# Patient Record
Sex: Male | Born: 1940 | Race: White | Hispanic: No | Marital: Married | Smoking: Never smoker
Health system: Southern US, Community
[De-identification: ages and names within clinical notes are randomized; demographics above are authoritative.]

## PROBLEM LIST (undated history)

## (undated) DIAGNOSIS — E538 Deficiency of other specified B group vitamins: Secondary | ICD-10-CM

## (undated) DIAGNOSIS — L309 Dermatitis, unspecified: Secondary | ICD-10-CM

## (undated) DIAGNOSIS — Z8509 Personal history of malignant neoplasm of other digestive organs: Secondary | ICD-10-CM

## (undated) DIAGNOSIS — E559 Vitamin D deficiency, unspecified: Secondary | ICD-10-CM

## (undated) DIAGNOSIS — E785 Hyperlipidemia, unspecified: Secondary | ICD-10-CM

## (undated) DIAGNOSIS — D696 Thrombocytopenia, unspecified: Secondary | ICD-10-CM

## (undated) HISTORY — PX: CHOLECYSTECTOMY: SHX55

## (undated) HISTORY — PX: BACK SURGERY: SHX140

## (undated) HISTORY — DX: Deficiency of other specified B group vitamins: E53.8

## (undated) HISTORY — DX: Vitamin D deficiency, unspecified: E55.9

## (undated) HISTORY — DX: Thrombocytopenia, unspecified: D69.6

## (undated) HISTORY — DX: Dermatitis, unspecified: L30.9

## (undated) HISTORY — DX: Hyperlipidemia, unspecified: E78.5

## (undated) HISTORY — PX: OTHER SURGICAL HISTORY: SHX169

## (undated) HISTORY — DX: Personal history of malignant neoplasm of other digestive organs: Z85.09

## (undated) HISTORY — PX: APPENDECTOMY: SHX54

---

## 2000-02-04 ENCOUNTER — Ambulatory Visit: Admission: RE | Admit: 2000-02-04 | Discharge: 2000-02-04 | Payer: Self-pay | Admitting: Internal Medicine

## 2016-07-05 ENCOUNTER — Inpatient Hospital Stay (HOSPITAL_COMMUNITY)
Admission: EM | Admit: 2016-07-05 | Discharge: 2016-07-11 | DRG: 435 | Disposition: A | Discharge status: Other Acute Inpatient Hospital | Payer: Medicare Other | Attending: Family Medicine | Admitting: Family Medicine

## 2016-07-05 ENCOUNTER — Encounter (HOSPITAL_COMMUNITY): Payer: Self-pay

## 2016-07-05 ENCOUNTER — Emergency Department (HOSPITAL_COMMUNITY): Payer: Medicare Other

## 2016-07-05 DIAGNOSIS — K831 Obstruction of bile duct: Secondary | ICD-10-CM

## 2016-07-05 DIAGNOSIS — N400 Enlarged prostate without lower urinary tract symptoms: Secondary | ICD-10-CM | POA: Diagnosis present

## 2016-07-05 DIAGNOSIS — Z885 Allergy status to narcotic agent status: Secondary | ICD-10-CM

## 2016-07-05 DIAGNOSIS — N401 Enlarged prostate with lower urinary tract symptoms: Secondary | ICD-10-CM | POA: Diagnosis present

## 2016-07-05 DIAGNOSIS — L299 Pruritus, unspecified: Secondary | ICD-10-CM | POA: Diagnosis not present

## 2016-07-05 DIAGNOSIS — Z23 Encounter for immunization: Secondary | ICD-10-CM

## 2016-07-05 DIAGNOSIS — K567 Ileus, unspecified: Secondary | ICD-10-CM

## 2016-07-05 DIAGNOSIS — R9389 Abnormal findings on diagnostic imaging of other specified body structures: Secondary | ICD-10-CM

## 2016-07-05 DIAGNOSIS — C240 Malignant neoplasm of extrahepatic bile duct: Secondary | ICD-10-CM | POA: Diagnosis present

## 2016-07-05 DIAGNOSIS — Z9049 Acquired absence of other specified parts of digestive tract: Secondary | ICD-10-CM

## 2016-07-05 DIAGNOSIS — C801 Malignant (primary) neoplasm, unspecified: Secondary | ICD-10-CM

## 2016-07-05 DIAGNOSIS — R1031 Right lower quadrant pain: Secondary | ICD-10-CM

## 2016-07-05 DIAGNOSIS — K859 Acute pancreatitis without necrosis or infection, unspecified: Secondary | ICD-10-CM

## 2016-07-05 DIAGNOSIS — R140 Abdominal distension (gaseous): Secondary | ICD-10-CM

## 2016-07-05 DIAGNOSIS — K838 Other specified diseases of biliary tract: Secondary | ICD-10-CM | POA: Diagnosis present

## 2016-07-05 DIAGNOSIS — R17 Unspecified jaundice: Secondary | ICD-10-CM | POA: Diagnosis present

## 2016-07-05 DIAGNOSIS — R338 Other retention of urine: Secondary | ICD-10-CM | POA: Diagnosis present

## 2016-07-05 DIAGNOSIS — R1011 Right upper quadrant pain: Secondary | ICD-10-CM | POA: Diagnosis present

## 2016-07-05 LAB — COMPREHENSIVE METABOLIC PANEL
ALT: 195 U/L — ABNORMAL HIGH (ref 17–63)
AST: 153 U/L — ABNORMAL HIGH (ref 15–41)
Albumin: 3.7 g/dL (ref 3.5–5.0)
Alkaline Phosphatase: 313 U/L — ABNORMAL HIGH (ref 38–126)
Anion gap: 6 (ref 5–15)
BUN: 13 mg/dL (ref 6–20)
CO2: 29 mmol/L (ref 22–32)
Calcium: 9.4 mg/dL (ref 8.9–10.3)
Chloride: 103 mmol/L (ref 101–111)
Creatinine, Ser: 0.74 mg/dL (ref 0.61–1.24)
GFR calc Af Amer: >60 mL/min (ref >60)
GFR calc non Af Amer: >60 mL/min (ref >60)
Glucose, Bld: 92 mg/dL (ref 65–99)
Potassium: 3.9 mmol/L (ref 3.5–5.1)
Sodium: 138 mmol/L (ref 135–145)
Total Bilirubin: 9.2 mg/dL — ABNORMAL HIGH (ref 0.3–1.2)
Total Protein: 7.1 g/dL (ref 6.5–8.1)

## 2016-07-05 LAB — URINALYSIS, ROUTINE W REFLEX MICROSCOPIC
GLUCOSE, UA: NEGATIVE mg/dL
Hgb urine dipstick: NEGATIVE
KETONES UR: NEGATIVE mg/dL
LEUKOCYTES UA: NEGATIVE
Nitrite: NEGATIVE
PH: 6 (ref 5.0–8.0)
Protein, ur: NEGATIVE mg/dL
Specific Gravity, Urine: 1.01 (ref 1.005–1.030)

## 2016-07-05 LAB — CBC
HEMATOCRIT: 37.4 % — AB (ref 39.0–52.0)
Hemoglobin: 12.9 g/dL — ABNORMAL LOW (ref 13.0–17.0)
MCH: 29.7 pg (ref 26.0–34.0)
MCHC: 34.5 g/dL (ref 30.0–36.0)
MCV: 86.2 fL (ref 78.0–100.0)
Platelets: 185 10*3/uL (ref 150–400)
RBC: 4.34 MIL/uL (ref 4.22–5.81)
RDW: 15.3 % (ref 11.5–15.5)
WBC: 6.5 10*3/uL (ref 4.0–10.5)

## 2016-07-05 LAB — LIPASE, BLOOD: Lipase: 52 U/L — ABNORMAL HIGH (ref 11–51)

## 2016-07-05 MED ORDER — IOPAMIDOL (ISOVUE-300) INJECTION 61%
100.0000 mL | Freq: Once | INTRAVENOUS | Status: AC | PRN
  Administered 2016-07-05: 100 mL via INTRAVENOUS

## 2016-07-05 NOTE — Progress Notes (Signed)
Patient listed as having [employer] without a pcp.  Patient confirms his pcp is Dr. Wende Neighbors in [place].  Sytem updated.

## 2016-07-05 NOTE — ED Triage Notes (Addendum)
Pt c/o RLQ abdominal pain x 3 weeks. Pt also states that his stool has been gray in color and his urine has been orange. Denies blood ing stool or urine. Also endorsing nausea and indigestion. Hx of cholecystectomy. Denies vomiting, diarrhea, chest pain, or SOB.

## 2016-07-05 NOTE — ED Provider Notes (Signed)
[facility] DEPT Provider Note   CSN: [id number] Arrival date & time: 07/05/16  1859     History   Chief Complaint Chief Complaint  Patient presents with  . Abdominal Pain    HPI DIAMON RENSCH is a 75 y.o. male.  HPI   Lower right abdominal pain for 3-4 days Aching pain, comes and goes, no pain at this time No fevers No n/v/diarrhea Stool looking gray for 3 weeks, urine looking orange for 3 weeks Occasional tylenol use. No etoh use.   History reviewed. No pertinent past medical history.  Patient Active Problem List   Diagnosis Date Noted  . Jaundice of recent onset 07/06/2016  . Abdominal pain, acute, right upper quadrant 07/06/2016  . Common bile duct (CBD) obstruction 07/06/2016  . 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 Biliary obstruction 07/06/2016    Past Surgical History:  Procedure Laterality Date  . APPENDECTOMY    . BACK SURGERY    . CHOLECYSTECTOMY         Home Medications    Prior to Admission medications   Not on File    Family History History reviewed. No pertinent family history.  Social History Social History  Substance Use Topics  . Smoking status: Never Smoker  . Smokeless tobacco: Never Used  . Alcohol use No     Allergies   Percocet [oxycodone-acetaminophen]   Review of Systems Review of Systems  Constitutional: Negative for fever.  Respiratory: Negative for cough and shortness of breath.   Cardiovascular: Negative for chest pain.  Gastrointestinal: Positive for abdominal pain. Negative for constipation, diarrhea, nausea and vomiting.  Genitourinary: Negative for dysuria.  Skin: Negative for rash.  Neurological: Negative for headaches (haven't eaten since noon, attributes ha to this, mild).     Physical Exam Updated Vital Signs BP (!) 153/81 (BP Location: Right Arm)   Pulse 67   Temp 98.2 F (36.8 C) (Oral)   Resp 18   Ht 5\' 7"  (1.702 m)   Wt 145 lb 11.6 oz (66.1 kg)   SpO2 100%   BMI 22.82 kg/m    Physical Exam   ED Treatments / Results  Labs (all labs ordered are listed, but only abnormal results are displayed) Labs Reviewed  LIPASE, BLOOD - Abnormal; Notable for the following:       Result Value   Lipase 52 (*)    All other components within normal limits  COMPREHENSIVE METABOLIC PANEL - Abnormal; Notable for the following:    AST 153 (*)    ALT 195 (*)    Alkaline Phosphatase 313 (*)    Total Bilirubin 9.2 (*)    All other components within normal limits  CBC - Abnormal; Notable for the following:    Hemoglobin 12.9 (*)    HCT 37.4 (*)    All other components within normal limits  URINALYSIS, ROUTINE W REFLEX MICROSCOPIC (NOT AT [facility]) - Abnormal; Notable for the following:    Color, Urine AMBER (*)    Bilirubin Urine MODERATE (*)    All other components within normal limits  SURGICAL PCR SCREEN  COMPREHENSIVE METABOLIC PANEL  CBC    EKG  EKG Interpretation None       Radiology Ct Abdomen Pelvis W Contrast  Result Date: 07/05/2016 CLINICAL DATA:  Subacute onset of right lower quadrant abdominal pain and nausea. Initial encounter. EXAM: CT ABDOMEN AND PELVIS WITH CONTRAST TECHNIQUE: Multidetector CT imaging of the abdomen and pelvis was performed using the standard protocol following  bolus administration of intravenous contrast. CONTRAST:  134mL ISOVUE-300 IOPAMIDOL (ISOVUE-300) INJECTION 61% COMPARISON:  CT of the abdomen and pelvis from 05/07/2011 FINDINGS: Lower chest: The visualized lung bases are grossly clear. The visualized portions of the mediastinum are unremarkable. Hepatobiliary: There is marked dilatation of the intrahepatic biliary ducts, particularly at the left hepatic lobe, though also to some extent within the inferior right hepatic lobe. On evaluation of coronal images, this is thought to reflect a mass at the proximal common bile duct along the edge of the liver, measuring approximately 2.4 x 1.7 x 1.7 cm, concerning for a cholangiocarcinoma.  The common bile duct remains normal in caliber status post cholecystectomy. Clips are noted at the gallbladder fossa. Pancreas: The pancreas is within normal limits. Spleen: The spleen is unremarkable in appearance. Adrenals/Urinary Tract: The adrenal glands are unremarkable in appearance. Scattered bilateral renal cysts are noted, larger on the left. Nonspecific perinephric stranding is noted bilaterally. There is no evidence of hydronephrosis. No renal or ureteral stones are identified. Stomach/Bowel: The stomach is unremarkable in appearance. The small bowel is within normal limits. The patient is status post appendectomy. The colon is unremarkable in appearance. Vascular/Lymphatic: Scattered calcification is seen along the abdominal aorta and its branches. There is slight ectasia of the infrarenal abdominal aorta, without evidence of aneurysmal dilatation. The inferior vena cava is grossly unremarkable. No retroperitoneal lymphadenopathy is seen. No pelvic sidewall lymphadenopathy is identified. Reproductive: The prostate is mildly enlarged, measuring 5.1 cm in transverse dimension. The bladder is mildly distended and grossly unremarkable. Other: No additional soft tissue abnormalities are seen. Musculoskeletal: No acute osseous abnormalities are identified. Disc space narrowing is noted along the lower lumbar spine. The visualized musculature is unremarkable in appearance. IMPRESSION: 1. Marked dilatation of the intrahepatic biliary ducts, particularly at the left hepatic lobe, though also to some extent within the inferior right hepatic lobe. On evaluation of coronal images, this is thought to reflect a mass at the proximal common bile duct along the edge of the liver, measuring approximately 2.4 x 1.7 x 1.7 cm, concerning for cholangiocarcinoma. ERCP or MRCP is recommended for further evaluation. 2. Scattered bilateral renal cysts noted. 3. Scattered aortic atherosclerosis. 4. Mildly enlarged prostate.  Electronically Signed   By: Garald Balding M.D.   On: 07/05/2016 23:56    Procedures Procedures (including critical care time)  Medications Ordered in ED Medications  0.9 %  sodium chloride infusion (not administered)  ondansetron (ZOFRAN) tablet 4 mg (not administered)    Or  ondansetron (ZOFRAN) injection 4 mg (not administered)  ketorolac (TORADOL) 15 MG/ML injection 15 mg (not administered)  iopamidol (ISOVUE-300) 61 % injection 100 mL (100 mLs Intravenous Contrast Given 07/05/16 2309)     Initial Impression / Assessment and Plan / ED Course  I have reviewed the triage vital signs and the nursing notes.  Pertinent labs & imaging results that were available during my care of the patient were reviewed by me and considered in my medical decision making (see chart for details).  Clinical Course   75 year old male presents to concern for acholic stool, dark urine for 3 weeks, in 3-4 days of right-sided abdominal pain. Labs show signs of biliary obstruction, with elevated AST, ALT, alkaline phosphatase and bilirubin. CT abdomen and pelvis was obtained which shows significant hepatobiliary dilation, with concern for mass at the proximal bile duct, concerning for cholangiocarcinoma.  Dr. Shanon Brow was consulted for admission.  Discussed with Dr. Penelope Coop of GI who recommends hospitalist admission, and  gastroenterology consult in the morning for likely ERCP.  Final Clinical Impressions(s) / ED Diagnoses   Final diagnoses:  Biliary obstruction, concern for mass as etiology, possible cholangiocarcinoma    New Prescriptions There are no discharge medications for this patient.    Gareth Morgan, MD 07/06/16 [phone]

## 2016-07-05 NOTE — ED Notes (Signed)
Pt has urine sample in triage.

## 2016-07-05 NOTE — ED Notes (Signed)
Trendon Naimi - daughter - [phone] requests to know where her father is moved to if assigned a room as they are about to leave.

## 2016-07-05 NOTE — ED Notes (Signed)
Patient transported to CT 

## 2016-07-06 ENCOUNTER — Other Ambulatory Visit (HOSPITAL_COMMUNITY): Payer: Self-pay | Admitting: Radiology

## 2016-07-06 ENCOUNTER — Encounter (HOSPITAL_COMMUNITY): Payer: Self-pay | Admitting: *Deleted

## 2016-07-06 ENCOUNTER — Inpatient Hospital Stay (HOSPITAL_COMMUNITY): Payer: Medicare Other

## 2016-07-06 DIAGNOSIS — Z23 Encounter for immunization: Secondary | ICD-10-CM | POA: Diagnosis not present

## 2016-07-06 DIAGNOSIS — K831 Obstruction of bile duct: Secondary | ICD-10-CM | POA: Diagnosis present

## 2016-07-06 DIAGNOSIS — L299 Pruritus, unspecified: Secondary | ICD-10-CM | POA: Diagnosis not present

## 2016-07-06 DIAGNOSIS — R17 Unspecified jaundice: Secondary | ICD-10-CM | POA: Diagnosis not present

## 2016-07-06 DIAGNOSIS — R9389 Abnormal findings on diagnostic imaging of other specified body structures: Secondary | ICD-10-CM | POA: Diagnosis present

## 2016-07-06 DIAGNOSIS — K838 Other specified diseases of biliary tract: Secondary | ICD-10-CM | POA: Diagnosis present

## 2016-07-06 DIAGNOSIS — R338 Other retention of urine: Secondary | ICD-10-CM | POA: Diagnosis present

## 2016-07-06 DIAGNOSIS — Z885 Allergy status to narcotic agent status: Secondary | ICD-10-CM | POA: Diagnosis not present

## 2016-07-06 DIAGNOSIS — N401 Enlarged prostate with lower urinary tract symptoms: Secondary | ICD-10-CM | POA: Diagnosis present

## 2016-07-06 DIAGNOSIS — R1011 Right upper quadrant pain: Secondary | ICD-10-CM | POA: Diagnosis not present

## 2016-07-06 DIAGNOSIS — C240 Malignant neoplasm of extrahepatic bile duct: Secondary | ICD-10-CM | POA: Diagnosis present

## 2016-07-06 DIAGNOSIS — K839 Disease of biliary tract, unspecified: Secondary | ICD-10-CM

## 2016-07-06 DIAGNOSIS — K859 Acute pancreatitis without necrosis or infection, unspecified: Secondary | ICD-10-CM | POA: Diagnosis not present

## 2016-07-06 DIAGNOSIS — R938 Abnormal findings on diagnostic imaging of other specified body structures: Secondary | ICD-10-CM

## 2016-07-06 DIAGNOSIS — Z9049 Acquired absence of other specified parts of digestive tract: Secondary | ICD-10-CM | POA: Diagnosis not present

## 2016-07-06 LAB — COMPREHENSIVE METABOLIC PANEL
ALBUMIN: 3.3 g/dL — AB (ref 3.5–5.0)
ALK PHOS: 296 U/L — AB (ref 38–126)
ALT: 182 U/L — ABNORMAL HIGH (ref 17–63)
ANION GAP: 7 (ref 5–15)
AST: 147 U/L — AB (ref 15–41)
BILIRUBIN TOTAL: 10 mg/dL — AB (ref 0.3–1.2)
BUN: 13 mg/dL (ref 6–20)
CALCIUM: 9.2 mg/dL (ref 8.9–10.3)
CO2: 27 mmol/L (ref 22–32)
Chloride: 105 mmol/L (ref 101–111)
Creatinine, Ser: 0.6 mg/dL — ABNORMAL LOW (ref 0.61–1.24)
GFR calc Af Amer: >60 mL/min (ref >60)
GLUCOSE: 96 mg/dL (ref 65–99)
Potassium: 3.8 mmol/L (ref 3.5–5.1)
Sodium: 139 mmol/L (ref 135–145)
TOTAL PROTEIN: 6.4 g/dL — AB (ref 6.5–8.1)

## 2016-07-06 LAB — CBC
HCT: 36.3 % — ABNORMAL LOW (ref 39.0–52.0)
Hemoglobin: 12.4 g/dL — ABNORMAL LOW (ref 13.0–17.0)
MCH: 29.2 pg (ref 26.0–34.0)
MCHC: 34.2 g/dL (ref 30.0–36.0)
MCV: 85.4 fL (ref 78.0–100.0)
Platelets: 169 10*3/uL (ref 150–400)
RBC: 4.25 MIL/uL (ref 4.22–5.81)
RDW: 15.3 % (ref 11.5–15.5)
WBC: 5.6 10*3/uL (ref 4.0–10.5)

## 2016-07-06 LAB — SURGICAL PCR SCREEN
MRSA, PCR: NEGATIVE
STAPHYLOCOCCUS AUREUS: NEGATIVE

## 2016-07-06 MED ORDER — ONDANSETRON HCL 4 MG PO TABS: 4.0000 mg | ORAL_TABLET | Freq: Four times a day (QID) | ORAL | Status: DC | PRN

## 2016-07-06 MED ORDER — GADOBENATE DIMEGLUMINE 529 MG/ML IV SOLN
14.0000 mL | Freq: Once | INTRAVENOUS | Status: AC | PRN
  Administered 2016-07-06: 14 mL via INTRAVENOUS

## 2016-07-06 MED ORDER — CHOLESTYRAMINE 4 G PO PACK
4.0000 g | PACK | Freq: Two times a day (BID) | ORAL | Status: DC
  Filled 2016-07-06 (×4): qty 1

## 2016-07-06 MED ORDER — KETOROLAC TROMETHAMINE 15 MG/ML IJ SOLN
15.0000 mg | Freq: Four times a day (QID) | INTRAMUSCULAR | Status: DC | PRN
  Administered 2016-07-07 – 2016-07-08 (×4): 15 mg via INTRAVENOUS
  Filled 2016-07-06 (×4): qty 1

## 2016-07-06 MED ORDER — SODIUM CHLORIDE 0.9 % IV SOLN
INTRAVENOUS | Status: AC
  Administered 2016-07-06: 03:00:00 via INTRAVENOUS

## 2016-07-06 MED ORDER — INFLUENZA VAC SPLIT QUAD 0.5 ML IM SUSY
0.5000 mL | PREFILLED_SYRINGE | INTRAMUSCULAR | Status: DC
  Filled 2016-07-06 (×2): qty 0.5

## 2016-07-06 MED ORDER — ONDANSETRON HCL 4 MG/2ML IJ SOLN
4.0000 mg | Freq: Four times a day (QID) | INTRAMUSCULAR | Status: DC | PRN
  Administered 2016-07-06 – 2016-07-11 (×8): 4 mg via INTRAVENOUS
  Filled 2016-07-06 (×8): qty 2

## 2016-07-06 NOTE — Consult Note (Signed)
[facility] Gastroenterology Consultation Note  Referring Provider: Dr. Irwin Brakeman [facility]) Primary Care Physician:  Ocie Doyne, MD  Reason for Consultation:  Obstructive jaundice.  HPI: Jeremiah Warren is a 75 y.o. male whom we've been asked to see for elevated LFTs, obstructive jaundice, abnormal CT scan.  Patient in static state of health until about 3 weeks ago.  At that time, he began having progressive weakness, dark urine, pruritus, jaundiced skin.  Some right-sided discomfort and loss-of-appetite.  No weight loss.  No blood in stool.  CT showed dilated intrahepatic ducts, mass near biliary bifurcation, normal extrahepatic bile ducts.  LFTs elevated in obstructive pattern.   History reviewed. No pertinent past medical history.  Past Surgical History:  Procedure Laterality Date  . APPENDECTOMY    . BACK SURGERY    . CHOLECYSTECTOMY      Prior to Admission medications   Not on File    Current Facility-Administered Medications  Medication Dose Route Frequency Provider Last Rate Last Dose  . 0.9 %  sodium chloride infusion   Intravenous Continuous Phillips Grout, MD 75 mL/hr at 07/06/16 0246    . [START ON 07/07/2016] Influenza vac split quadrivalent PF (FLUARIX) injection 0.5 mL  0.5 mL Intramuscular Tomorrow-1000 Rachal A Shanon Brow, MD      . ketorolac (TORADOL) 15 MG/ML injection 15 mg  15 mg Intravenous Q6H PRN Phillips Grout, MD      . ondansetron (ZOFRAN) tablet 4 mg  4 mg Oral Q6H PRN Phillips Grout, MD       Or  . ondansetron (ZOFRAN) injection 4 mg  4 mg Intravenous Q6H PRN Phillips Grout, MD        Allergies as of 07/05/2016 - Review Complete 07/05/2016  Allergen Reaction Noted  . Percocet [oxycodone-acetaminophen] Anxiety 07/05/2016    History reviewed. No pertinent family history.  Social History   Social History  . Marital status: Married    Spouse name: N/A  . Number of children: N/A  . Years of education: N/A   Occupational History  . Not on file.    Social History Main Topics  . Smoking status: Never Smoker  . Smokeless tobacco: Never Used  . Alcohol use No  . Drug use: Unknown  . Sexual activity: Not on file   Other Topics Concern  . Not on file   Social History Narrative  . No narrative on file    Review of Systems: Positive = bold Gen: Denies any fever, chills, rigors, night sweats, anorexia, fatigue, weakness, malaise, involuntary weight loss, and sleep disorder CV: Denies chest pain, angina, palpitations, syncope, orthopnea, PND, peripheral edema, and claudication. Resp: Denies dyspnea, cough, sputum, wheezing, coughing up blood. GI: Described in detail in HPI.    GU : Denies urinary burning, blood in urine, urinary frequency, urinary hesitancy, nocturnal urination, and urinary incontinence. MS: Denies joint pain or swelling.  Denies muscle weakness, cramps, atrophy.  Derm: Denies rash, itching, oral ulcerations, hives, unhealing ulcers.  Psych: Denies depression, anxiety, memory loss, suicidal ideation, hallucinations,  and confusion. Heme: Denies bruising, bleeding, and enlarged lymph nodes. Neuro:  Denies any headaches, dizziness, paresthesias. Endo:  Denies any problems with DM, thyroid, adrenal function.  Physical Exam: Vital signs in last 24 hours: Temp:  [97.5 F (36.4 C)-98.5 F (36.9 C)] 98.2 F (36.8 C) (10/11 0227) Pulse Rate:  [59-76] 67 (10/11 0227) Resp:  [15-18] 18 (10/11 0227) BP: (145-164)/(75-84) 153/81 (10/11 0227) SpO2:  [98 %-100 %] 100 % (  10/11 0227) Weight:  [60 kg (132 lb 4 oz)-66.1 kg (145 lb 11.6 oz)] 66.1 kg (145 lb 11.6 oz) (10/11 0227) Last BM Date: 07/05/16 General:   Alert,  Jaundiced, NAD Head:  Normocephalic and atraumatic. Eyes:  Sclera clear, no icterus.   Conjunctiva pink. Ears:  Normal auditory acuity. Nose:  No deformity, discharge,  or lesions. Mouth:  No deformity or lesions.  Oropharynx pink & moist. Neck:  Supple; no masses or thyromegaly. Lungs:  Clear throughout  to auscultation.   No wheezes, crackles, or rhonchi. No acute distress. Heart:  Regular rate and rhythm; no murmurs, clicks, rubs,  or gallops. Abdomen:  Soft, mild epigastric tenderness without peritonitis. No masses, hepatosplenomegaly or hernias noted. Normal bowel sounds, without guarding, and without rebound.     Msk:  Symmetrical without gross deformities. Normal posture. Pulses:  Normal pulses noted. Extremities:  Without clubbing or edema. Neurologic:  Alert and  oriented x4;  grossly normal neurologically. Skin:  Scattered telangiectasias, some ecchymoses on extremities, otherwise intact without significant lesions or rashes. Psych:  Alert and cooperative. Normal mood and affect.   Lab Results:  Recent Labs  07/05/16 1952 07/06/16 0530  WBC 6.5 5.6  HGB 12.9* 12.4*  HCT 37.4* 36.3*  PLT 185 169   BMET  Recent Labs  07/05/16 1952 07/06/16 0530  NA 138 139  K 3.9 3.8  CL 103 105  CO2 29 27  GLUCOSE 92 96  BUN 13 13  CREATININE 0.74 0.60*  CALCIUM 9.4 9.2   LFT  Recent Labs  07/06/16 0530  PROT 6.4*  ALBUMIN 3.3*  AST 147*  ALT 182*  ALKPHOS 296*  BILITOT 10.0*   PT/INR No results for input(s): LABPROT, INR in the last 72 hours.  Studies/Results: Ct Abdomen Pelvis W Contrast  Result Date: 07/05/2016 CLINICAL DATA:  Subacute onset of right lower quadrant abdominal pain and nausea. Initial encounter. EXAM: CT ABDOMEN AND PELVIS WITH CONTRAST TECHNIQUE: Multidetector CT imaging of the abdomen and pelvis was performed using the standard protocol following bolus administration of intravenous contrast. CONTRAST:  13mL ISOVUE-300 IOPAMIDOL (ISOVUE-300) INJECTION 61% COMPARISON:  CT of the abdomen and pelvis from 05/07/2011 FINDINGS: Lower chest: The visualized lung bases are grossly clear. The visualized portions of the mediastinum are unremarkable. Hepatobiliary: There is marked dilatation of the intrahepatic biliary ducts, particularly at the left hepatic  lobe, though also to some extent within the inferior right hepatic lobe. On evaluation of coronal images, this is thought to reflect a mass at the proximal common bile duct along the edge of the liver, measuring approximately 2.4 x 1.7 x 1.7 cm, concerning for a cholangiocarcinoma. The common bile duct remains normal in caliber status post cholecystectomy. Clips are noted at the gallbladder fossa. Pancreas: The pancreas is within normal limits. Spleen: The spleen is unremarkable in appearance. Adrenals/Urinary Tract: The adrenal glands are unremarkable in appearance. Scattered bilateral renal cysts are noted, larger on the left. Nonspecific perinephric stranding is noted bilaterally. There is no evidence of hydronephrosis. No renal or ureteral stones are identified. Stomach/Bowel: The stomach is unremarkable in appearance. The small bowel is within normal limits. The patient is status post appendectomy. The colon is unremarkable in appearance. Vascular/Lymphatic: Scattered calcification is seen along the abdominal aorta and its branches. There is slight ectasia of the infrarenal abdominal aorta, without evidence of aneurysmal dilatation. The inferior vena cava is grossly unremarkable. No retroperitoneal lymphadenopathy is seen. No pelvic sidewall lymphadenopathy is identified. Reproductive: The prostate is  mildly enlarged, measuring 5.1 cm in transverse dimension. The bladder is mildly distended and grossly unremarkable. Other: No additional soft tissue abnormalities are seen. Musculoskeletal: No acute osseous abnormalities are identified. Disc space narrowing is noted along the lower lumbar spine. The visualized musculature is unremarkable in appearance. IMPRESSION: 1. Marked dilatation of the intrahepatic biliary ducts, particularly at the left hepatic lobe, though also to some extent within the inferior right hepatic lobe. On evaluation of coronal images, this is thought to reflect a mass at the proximal common  bile duct along the edge of the liver, measuring approximately 2.4 x 1.7 x 1.7 cm, concerning for cholangiocarcinoma. ERCP or MRCP is recommended for further evaluation. 2. Scattered bilateral renal cysts noted. 3. Scattered aortic atherosclerosis. 4. Mildly enlarged prostate. Electronically Signed   By: Garald Balding M.D.   On: 07/05/2016 23:56   Impression:  1.  Obstructive jaundice.  CT shows mass near biliary bifurcation, more predominant proximal left intrahepatic duct.  Concerning for cholangiocarcinoma, Klatskin's type tumor. 2.  Elevated LFTs and dilated intrahepatic ducts (extrahepatic ducts ok), from #1 above. 3.  Pruritus, from #1 and #2 above.  Plan:  1.  MRI/MRCP today for further clarification. 2.  Lesion not amenable to ERCP; pending MRI/MRCP results, will need Interventional radiology consultation for consideration of percutaneous drain.  If no evidence of metastatic disease, would likely get in-house surgical consultation as well. 3.  Cholestyramine 4 grams po bid for now for pruritus. 4.  Check CEA and Ca 19-9. 5.  Follow LFTs and PT/INR. 6.  [facility] will follow.   LOS: 0 days   Elmer Boutelle,Telvin M  07/06/2016, 9:46 AM  Pager [phone] If no answer or after 5 PM call [phone]

## 2016-07-06 NOTE — ED Notes (Signed)
Care Hand Off Report given to Apolonio Schneiders, RN on [facility].

## 2016-07-06 NOTE — Progress Notes (Signed)
PROGRESS NOTE    Jeremiah Warren  [id number]  DOB: [birth year]  DOA: 07/05/2016 PCP: Ocie Doyne, MD Outpatient Specialists:   Hospital course: Jeremiah Warren is a 75 y.o. male healthy comes in with 3 weeks of progressive worsening orange urine along with yellow skin and right sided abdominal dull achiness.  He does not associate the pain with eating or not.  Its random times.  No vomiting.  No nausea.  No weight loss.  Ct shows ductal dilation with concern for mass, and his bili is over 9.  Pt referred for admission for biliary obstruction likely due to a mass.  Assessment & Plan:   1. Acute biliary obstruction - GI was consulted for MRCP hopefully later today.  2. Hyperbilirubinemia - secondary to obstruction, awaiting GI evaluation for further management recommendations.   3. Nonspecific abdominal pain - symptoms controlled and stable per patient.    DVT prophylaxis: SCDs, holding anticoagulants  Code Status: full Family Communication: wife bedside Disposition Plan: TBD   Consultants:  [facility]  Procedures:  pending  Antimicrobials:  n/a   Subjective: Pt without complaints, says that the pruritus comes and goes  Objective: Vitals:   07/05/16 2048 07/05/16 2258 07/06/16 0032 07/06/16 0227  BP: 163/78 145/75 148/76 (!) 153/81  Pulse: 62 (!) 59 76 67  Resp: 15 15 18 18   Temp: 97.5 F (36.4 C)   98.2 F (36.8 C)  TempSrc: Oral   Oral  SpO2: 99% 98% 98% 100%  Weight:    66.1 kg (145 lb 11.6 oz)  Height:    5\' 7"  (1.702 m)    Intake/Output Summary (Last 24 hours) at 07/06/16 0803 Last data filed at 07/06/16 0700  Gross per 24 hour  Intake            317.5 ml  Output                0 ml  Net            317.5 ml   Filed Weights   07/05/16 1921 07/06/16 0227  Weight: 60 kg (132 lb 4 oz) 66.1 kg (145 lb 11.6 oz)    Exam:  General exam: well developed, well nourished, no distress, cooperative, icteric sclera bilateral Respiratory system: Clear.  No increased work of breathing. Cardiovascular system: S1 & S2 heard, RRR. No JVD, murmurs, gallops, clicks or pedal edema. Gastrointestinal system: Abdomen is nondistended, soft and nontender. Normal bowel sounds heard. Central nervous system: Alert and oriented. No focal neurological deficits. Extremities: no CCE.  Data Reviewed: Basic Metabolic Panel:  Recent Labs Lab 07/05/16 1952 07/06/16 0530  NA 138 139  K 3.9 3.8  CL 103 105  CO2 29 27  GLUCOSE 92 96  BUN 13 13  CREATININE 0.74 0.60*  CALCIUM 9.4 9.2   Liver Function Tests:  Recent Labs Lab 07/05/16 1952 07/06/16 0530  AST 153* 147*  ALT 195* 182*  ALKPHOS 313* 296*  BILITOT 9.2* 10.0*  PROT 7.1 6.4*  ALBUMIN 3.7 3.3*    Recent Labs Lab 07/05/16 1952  LIPASE 52*   No results for input(s): AMMONIA in the last 168 hours. CBC:  Recent Labs Lab 07/05/16 1952 07/06/16 0530  WBC 6.5 5.6  HGB 12.9* 12.4*  HCT 37.4* 36.3*  MCV 86.2 85.4  PLT 185 169   Cardiac Enzymes: No results for input(s): CKTOTAL, CKMB, CKMBINDEX, TROPONINI in the last 168 hours. CBG (last 3)  No results for input(s): GLUCAP  in the last 72 hours. Recent Results (from the past 240 hour(s))  Surgical PCR screen     Status: None   Collection Time: 07/06/16  2:48 AM  Result Value Ref Range Status   MRSA, PCR NEGATIVE NEGATIVE Final   Staphylococcus aureus NEGATIVE NEGATIVE Final    Comment:        The Xpert SA Assay (FDA approved for NASAL specimens in patients over 63 years of age), is one component of a comprehensive surveillance program.  Test performance has been validated by [facility] for patients greater than or equal to 2 year old. It is not intended to diagnose infection nor to guide or monitor treatment.      Studies: Ct Abdomen Pelvis W Contrast  Result Date: 07/05/2016 CLINICAL DATA:  Subacute onset of right lower quadrant abdominal pain and nausea. Initial encounter. EXAM: CT ABDOMEN AND PELVIS WITH  CONTRAST TECHNIQUE: Multidetector CT imaging of the abdomen and pelvis was performed using the standard protocol following bolus administration of intravenous contrast. CONTRAST:  153mL ISOVUE-300 IOPAMIDOL (ISOVUE-300) INJECTION 61% COMPARISON:  CT of the abdomen and pelvis from 05/07/2011 FINDINGS: Lower chest: The visualized lung bases are grossly clear. The visualized portions of the mediastinum are unremarkable. Hepatobiliary: There is marked dilatation of the intrahepatic biliary ducts, particularly at the left hepatic lobe, though also to some extent within the inferior right hepatic lobe. On evaluation of coronal images, this is thought to reflect a mass at the proximal common bile duct along the edge of the liver, measuring approximately 2.4 x 1.7 x 1.7 cm, concerning for a cholangiocarcinoma. The common bile duct remains normal in caliber status post cholecystectomy. Clips are noted at the gallbladder fossa. Pancreas: The pancreas is within normal limits. Spleen: The spleen is unremarkable in appearance. Adrenals/Urinary Tract: The adrenal glands are unremarkable in appearance. Scattered bilateral renal cysts are noted, larger on the left. Nonspecific perinephric stranding is noted bilaterally. There is no evidence of hydronephrosis. No renal or ureteral stones are identified. Stomach/Bowel: The stomach is unremarkable in appearance. The small bowel is within normal limits. The patient is status post appendectomy. The colon is unremarkable in appearance. Vascular/Lymphatic: Scattered calcification is seen along the abdominal aorta and its branches. There is slight ectasia of the infrarenal abdominal aorta, without evidence of aneurysmal dilatation. The inferior vena cava is grossly unremarkable. No retroperitoneal lymphadenopathy is seen. No pelvic sidewall lymphadenopathy is identified. Reproductive: The prostate is mildly enlarged, measuring 5.1 cm in transverse dimension. The bladder is mildly distended  and grossly unremarkable. Other: No additional soft tissue abnormalities are seen. Musculoskeletal: No acute osseous abnormalities are identified. Disc space narrowing is noted along the lower lumbar spine. The visualized musculature is unremarkable in appearance. IMPRESSION: 1. Marked dilatation of the intrahepatic biliary ducts, particularly at the left hepatic lobe, though also to some extent within the inferior right hepatic lobe. On evaluation of coronal images, this is thought to reflect a mass at the proximal common bile duct along the edge of the liver, measuring approximately 2.4 x 1.7 x 1.7 cm, concerning for cholangiocarcinoma. ERCP or MRCP is recommended for further evaluation. 2. Scattered bilateral renal cysts noted. 3. Scattered aortic atherosclerosis. 4. Mildly enlarged prostate. Electronically Signed   By: Garald Balding M.D.   On: 07/05/2016 23:56     Scheduled Meds: . [START ON 07/07/2016] Influenza vac split quadrivalent PF  0.5 mL Intramuscular Tomorrow-1000   Continuous Infusions: . sodium chloride 75 mL/hr at 07/06/16 0246  Principal Problem:   Jaundice of recent onset Active Problems:   Abdominal pain, acute, right upper quadrant   Common bile duct (CBD) obstruction   Common bile duct mass   Biliary obstruction   Time spent:   Irwin Brakeman, MD, FAAFP [facility] Pager [phone]  If 7PM-7AM, please contact night-coverage [email or web address] Password [email or web address] 07/06/2016, 8:03 AM    LOS: 0 days

## 2016-07-06 NOTE — H&P (Signed)
History and Physical    Jeremiah Warren [id number] DOB: March 05, 1941 DOA: 07/05/2016  PCP: Ocie Doyne, MD  Patient coming from:  home  Chief Complaint:  Urine is orange, he is turning yellow and having right sided abdominal pain for last 3 weeks  HPI: Jeremiah Warren is a 75 y.o. male healthy comes in with 3 weeks of progressive worsening orange urine along with yellow skin and right sided abdominal dull achiness.  He does not associate the pain with eating or not.  Its random times.  No vomiting.  No nausea.  No weight loss.  Ct shows ductal dilation with concern for mass, and his bili is over 9.  Pt referred for admission for biliary obstruction likely due to a mass.  Review of Systems: As per HPI otherwise 10 point review of systems negative.   History reviewed. No pertinent past medical history.  Past Surgical History:  Procedure Laterality Date  . APPENDECTOMY    . BACK SURGERY    . CHOLECYSTECTOMY       reports that he has never smoked. He has never used smokeless tobacco. He reports that he does not drink alcohol. His drug history is not on file.  Allergies  Allergen Reactions  . Percocet [Oxycodone-Acetaminophen] Anxiety    History reviewed. No pertinent family history. no family history of GI cancers  Prior to Admission medications   Not on File  none  Physical Exam: Vitals:   07/05/16 1921 07/05/16 2048 07/05/16 2258 07/06/16 0032  BP:  163/78 145/75 148/76  Pulse:  62 (!) 59 76  Resp:  15 15 18   Temp:  97.5 F (36.4 C)    TempSrc:  Oral    SpO2:  99% 98% 98%  Weight: 60 kg (132 lb 4 oz)     Height: 5\' 7"  (1.702 m)         Constitutional: NAD, calm, comfortable, jaundiced Vitals:   07/05/16 1921 07/05/16 2048 07/05/16 2258 07/06/16 0032  BP:  163/78 145/75 148/76  Pulse:  62 (!) 59 76  Resp:  15 15 18   Temp:  97.5 F (36.4 C)    TempSrc:  Oral    SpO2:  99% 98% 98%  Weight: 60 kg (132 lb 4 oz)     Height: 5\' 7"  (1.702 m)      Eyes:  PERRL, icteric sclera ENMT: Mucous membranes are moist. Posterior pharynx clear of any exudate or lesions.Normal dentition.  Neck: normal, supple, no masses, no thyromegaly Respiratory: clear to auscultation bilaterally, no wheezing, no crackles. Normal respiratory effort. No accessory muscle use.  Cardiovascular: Regular rate and rhythm, no murmurs / rubs / gallops. No extremity edema. 2+ pedal pulses. No carotid bruits.  Abdomen: no tenderness, no masses palpated. No hepatosplenomegaly. Bowel sounds positive.  Musculoskeletal: no clubbing / cyanosis. No joint deformity upper and lower extremities. Good ROM, no contractures. Normal muscle tone.  Skin: no rashes, lesions, ulcers. No induration Neurologic: CN 2-12 grossly intact. Sensation intact, DTR normal. Strength 5/5 in all 4.  Psychiatric: Normal judgment and insight. Alert and oriented x 3. Normal mood.    Labs on Admission: I have personally reviewed following labs and imaging studies  CBC:  Recent Labs Lab 07/05/16 1952  WBC 6.5  HGB 12.9*  HCT 37.4*  MCV 86.2  PLT 123XX123   Basic Metabolic Panel:  Recent Labs Lab 07/05/16 1952  NA 138  K 3.9  CL 103  CO2 29  GLUCOSE 92  BUN  13  CREATININE 0.74  CALCIUM 9.4   GFR: Estimated Creatinine Clearance: 66.7 mL/min (by C-G formula based on SCr of 0.74 mg/dL). Liver Function Tests:  Recent Labs Lab 07/05/16 1952  AST 153*  ALT 195*  ALKPHOS 313*  BILITOT 9.2*  PROT 7.1  ALBUMIN 3.7    Recent Labs Lab 07/05/16 1952  LIPASE 52*    Urine analysis:    Component Value Date/Time   COLORURINE AMBER (A) 07/05/2016 2117   APPEARANCEUR CLEAR 07/05/2016 2117   LABSPEC 1.010 07/05/2016 2117   PHURINE 6.0 07/05/2016 2117   GLUCOSEU NEGATIVE 07/05/2016 2117   HGBUR NEGATIVE 07/05/2016 2117   BILIRUBINUR MODERATE (A) 07/05/2016 2117   [place] NEGATIVE 07/05/2016 2117   PROTEINUR NEGATIVE 07/05/2016 2117   NITRITE NEGATIVE 07/05/2016 2117   LEUKOCYTESUR NEGATIVE  07/05/2016 2117     Radiological Exams on Admission: Ct Abdomen Pelvis W Contrast  Result Date: 07/05/2016 CLINICAL DATA:  Subacute onset of right lower quadrant abdominal pain and nausea. Initial encounter. EXAM: CT ABDOMEN AND PELVIS WITH CONTRAST TECHNIQUE: Multidetector CT imaging of the abdomen and pelvis was performed using the standard protocol following bolus administration of intravenous contrast. CONTRAST:  18mL ISOVUE-300 IOPAMIDOL (ISOVUE-300) INJECTION 61% COMPARISON:  CT of the abdomen and pelvis from 05/07/2011 FINDINGS: Lower chest: The visualized lung bases are grossly clear. The visualized portions of the mediastinum are unremarkable. Hepatobiliary: There is marked dilatation of the intrahepatic biliary ducts, particularly at the left hepatic lobe, though also to some extent within the inferior right hepatic lobe. On evaluation of coronal images, this is thought to reflect a mass at the proximal common bile duct along the edge of the liver, measuring approximately 2.4 x 1.7 x 1.7 cm, concerning for a cholangiocarcinoma. The common bile duct remains normal in caliber status post cholecystectomy. Clips are noted at the gallbladder fossa. Pancreas: The pancreas is within normal limits. Spleen: The spleen is unremarkable in appearance. Adrenals/Urinary Tract: The adrenal glands are unremarkable in appearance. Scattered bilateral renal cysts are noted, larger on the left. Nonspecific perinephric stranding is noted bilaterally. There is no evidence of hydronephrosis. No renal or ureteral stones are identified. Stomach/Bowel: The stomach is unremarkable in appearance. The small bowel is within normal limits. The patient is status post appendectomy. The colon is unremarkable in appearance. Vascular/Lymphatic: Scattered calcification is seen along the abdominal aorta and its branches. There is slight ectasia of the infrarenal abdominal aorta, without evidence of aneurysmal dilatation. The inferior  vena cava is grossly unremarkable. No retroperitoneal lymphadenopathy is seen. No pelvic sidewall lymphadenopathy is identified. Reproductive: The prostate is mildly enlarged, measuring 5.1 cm in transverse dimension. The bladder is mildly distended and grossly unremarkable. Other: No additional soft tissue abnormalities are seen. Musculoskeletal: No acute osseous abnormalities are identified. Disc space narrowing is noted along the lower lumbar spine. The visualized musculature is unremarkable in appearance. IMPRESSION: 1. Marked dilatation of the intrahepatic biliary ducts, particularly at the left hepatic lobe, though also to some extent within the inferior right hepatic lobe. On evaluation of coronal images, this is thought to reflect a mass at the proximal common bile duct along the edge of the liver, measuring approximately 2.4 x 1.7 x 1.7 cm, concerning for cholangiocarcinoma. ERCP or MRCP is recommended for further evaluation. 2. Scattered bilateral renal cysts noted. 3. Scattered aortic atherosclerosis. 4. Mildly enlarged prostate. Electronically Signed   By: Garald Balding M.D.   On: 07/05/2016 23:56    Assessment/Plan 75 yo male with new  jaundice, abdominal pain and labs reflecting an obstructive pattern with ct results also concerning for possible new mass in the common bile duct  Principal Problem:   Jaundice of recent onset- ED is calling GI to notify of the possible need for ERCP in the am.  Will keep pt npo and hold anticoagulants for likely ERCP in the am and possible stent placement to relieve the obstruction and biopsy if needed.  Gentle ivf overnight.    Active Problems:   Abdominal pain, acute, right upper quadrant- as above   Common bile duct (CBD) obstruction- as above   Common bile duct mass- as above    DVT prophylaxis:  scds Code Status:   full Family Communication:  none Disposition Plan:  Per day team Consults called:   GI pending call back from EDP Admission status:   admit   Angelli Baruch A MD [facility]  If 7PM-7AM, please contact night-coverage [email or web address] Password [email or web address]  07/06/2016, 1:09 AM

## 2016-07-07 ENCOUNTER — Inpatient Hospital Stay (HOSPITAL_COMMUNITY): Payer: Medicare Other

## 2016-07-07 ENCOUNTER — Encounter (HOSPITAL_COMMUNITY): Payer: Self-pay | Admitting: General Surgery

## 2016-07-07 DIAGNOSIS — C240 Malignant neoplasm of extrahepatic bile duct: Secondary | ICD-10-CM | POA: Diagnosis present

## 2016-07-07 HISTORY — PX: IR GENERIC HISTORICAL: IMG1180011

## 2016-07-07 LAB — COMPREHENSIVE METABOLIC PANEL
ALBUMIN: 3.1 g/dL — AB (ref 3.5–5.0)
ALT: 181 U/L — ABNORMAL HIGH (ref 17–63)
ANION GAP: 6 (ref 5–15)
AST: 168 U/L — ABNORMAL HIGH (ref 15–41)
Alkaline Phosphatase: 285 U/L — ABNORMAL HIGH (ref 38–126)
BUN: 15 mg/dL (ref 6–20)
CHLORIDE: 105 mmol/L (ref 101–111)
CO2: 26 mmol/L (ref 22–32)
Calcium: 8.8 mg/dL — ABNORMAL LOW (ref 8.9–10.3)
Creatinine, Ser: 0.6 mg/dL — ABNORMAL LOW (ref 0.61–1.24)
GFR calc Af Amer: >60 mL/min (ref >60)
GFR calc non Af Amer: >60 mL/min (ref >60)
GLUCOSE: 109 mg/dL — AB (ref 65–99)
POTASSIUM: 3.9 mmol/L (ref 3.5–5.1)
SODIUM: 137 mmol/L (ref 135–145)
Total Bilirubin: 11.3 mg/dL — ABNORMAL HIGH (ref 0.3–1.2)
Total Protein: 6 g/dL — ABNORMAL LOW (ref 6.5–8.1)

## 2016-07-07 LAB — PROTIME-INR
INR: 0.91
Prothrombin Time: 12.3 seconds (ref 11.4–15.2)

## 2016-07-07 MED ORDER — HYDROMORPHONE HCL 1 MG/ML IJ SOLN
1.0000 mg | INTRAMUSCULAR | Status: DC | PRN
  Administered 2016-07-08: 1 mg via INTRAVENOUS
  Filled 2016-07-07: qty 1

## 2016-07-07 MED ORDER — PIPERACILLIN-TAZOBACTAM 3.375 G IVPB
3.3750 g | INTRAVENOUS | Status: AC
  Administered 2016-07-07: 3.375 g via INTRAVENOUS

## 2016-07-07 MED ORDER — MIDAZOLAM HCL 2 MG/2ML IJ SOLN
INTRAMUSCULAR | Status: AC
  Filled 2016-07-07: qty 4

## 2016-07-07 MED ORDER — HYDROMORPHONE HCL 2 MG/ML IJ SOLN
INTRAMUSCULAR | Status: AC
  Administered 2016-07-07: 2 mg
  Filled 2016-07-07: qty 1

## 2016-07-07 MED ORDER — MIDAZOLAM HCL 2 MG/2ML IJ SOLN
INTRAMUSCULAR | Status: AC | PRN
  Administered 2016-07-07 (×3): 1 mg via INTRAVENOUS

## 2016-07-07 MED ORDER — LIDOCAINE HCL 1 % IJ SOLN
INTRAMUSCULAR | Status: AC | PRN
  Administered 2016-07-07: 10 mL

## 2016-07-07 MED ORDER — LIDOCAINE HCL (PF) 1 % IJ SOLN
INTRAMUSCULAR | Status: AC
  Filled 2016-07-07: qty 30

## 2016-07-07 MED ORDER — FLEET ENEMA 7-19 GM/118ML RE ENEM
1.0000 | ENEMA | Freq: Once | RECTAL | Status: AC
Start: 2016-07-07 — End: 2016-07-07
  Administered 2016-07-07: 1 via RECTAL
  Filled 2016-07-07: qty 1

## 2016-07-07 MED ORDER — IOPAMIDOL (ISOVUE-300) INJECTION 61%
10.0000 mL | Freq: Once | INTRAVENOUS | Status: AC | PRN
  Administered 2016-07-07: 10 mL

## 2016-07-07 MED ORDER — FENTANYL CITRATE (PF) 100 MCG/2ML IJ SOLN
INTRAMUSCULAR | Status: AC
  Filled 2016-07-07: qty 4

## 2016-07-07 MED ORDER — POLYETHYLENE GLYCOL 3350 17 G PO PACK
17.0000 g | PACK | Freq: Every day | ORAL | Status: DC
  Administered 2016-07-07: 17 g via ORAL
  Filled 2016-07-07 (×2): qty 1

## 2016-07-07 MED ORDER — LIDOCAINE HCL 1 % IJ SOLN
INTRAMUSCULAR | Status: AC
  Filled 2016-07-07: qty 20

## 2016-07-07 MED ORDER — FENTANYL CITRATE (PF) 100 MCG/2ML IJ SOLN
INTRAMUSCULAR | Status: AC | PRN
  Administered 2016-07-07 (×2): 50 ug via INTRAVENOUS

## 2016-07-07 NOTE — Sedation Documentation (Signed)
Patient is resting comfortably. 

## 2016-07-07 NOTE — Consult Note (Signed)
[facility] Surgery Consult Note  RONN Warren Mar 09, 1941  [id number].    Requesting MD: Paulita Fujita Chief Complaint/Reason for Consult: Obstructive jaundice  HPI:  Jeremiah Warren  is a 75yo male who presented to [facility] 07/05/16 with 3 weeks of progressive worsening orange urine, grey stools, yellow skin, weakness, and right sided abdominal achiness. Denies weight loss, dysuria, melena, hematochezia, fevers, or chills.   Workup: - CT scan showed dilated intrahepatic ducts, mass near biliary bifurcation, normal extrahepatic bile ducts - MRCP showed a mass at the common bile duct bifurcation measures 1.7 x 1.5 x 1.2 cm - lipase 52, bilirubin 11.3, AST/ALT 181/168, alk phos 285, PT/INR 12.3/0.91 - Ca19-9 and CEA pending  General surgery consulted for a discussion of down-the-road surgical candidacy.   No significant PMH Abdominal surgical history include appendectomy, cholecystectomy Denies home use of anticoagulants Nonsmoker Denies alcohol use Employment: land surveying  ROS: All systems reviewed and otherwise negative except for as above  History reviewed. No pertinent family history.  History reviewed. No pertinent past medical history.  Past Surgical History:  Procedure Laterality Date  . APPENDECTOMY    . BACK SURGERY    . CHOLECYSTECTOMY      Social History:  reports that he has never smoked. He has never used smokeless tobacco. He reports that he does not drink alcohol. His drug history is not on file.  Allergies:  Allergies  Allergen Reactions  . Percocet [Oxycodone-Acetaminophen] Anxiety    No prescriptions prior to admission.    Blood pressure 140/65, pulse 65, temperature 98 F (36.7 C), temperature source Oral, resp. rate 16, height '5\' 7"'$  (1.702 m), weight 145 lb 11.6 oz (66.1 kg), SpO2 98 %. Physical Exam: General: pleasant, WD/WN white male who is sitting up in bed in NAD HEENT: head is normocephalic, atraumatic.  Scleral icterus.  Mouth is  pink and moist Heart: regular, rate, and rhythm.  Palpable pedal pulses bilaterally Lungs: CTAB.  Respiratory effort nonlabored Abd: previous lap incisions not seen.  soft, ND, +BS, no masses, hernias, or organomegaly. Minimal lower abdominal tenderness. No TTP RUQ. MS: all 4 extremities are symmetrical with no cyanosis, clubbing, or edema. Skin: warm and dry with no masses, lesions, or rashes Psych: A&Ox3 with an appropriate affect.  Results for orders placed or performed during the hospital encounter of 07/05/16 (from the past 48 hour(s))  Lipase, blood     Status: Abnormal   Collection Time: 07/05/16  7:52 PM  Result Value Ref Range   Lipase 52 (H) 11 - 51 U/L  Comprehensive metabolic panel     Status: Abnormal   Collection Time: 07/05/16  7:52 PM  Result Value Ref Range   Sodium 138 135 - 145 mmol/L   Potassium 3.9 3.5 - 5.1 mmol/L   Chloride 103 101 - 111 mmol/L   CO2 29 22 - 32 mmol/L   Glucose, Bld 92 65 - 99 mg/dL   BUN 13 6 - 20 mg/dL   Creatinine, Ser 0.74 0.61 - 1.24 mg/dL   Calcium 9.4 8.9 - 10.3 mg/dL   Total Protein 7.1 6.5 - 8.1 g/dL   Albumin 3.7 3.5 - 5.0 g/dL   AST 153 (H) 15 - 41 U/L   ALT 195 (H) 17 - 63 U/L   Alkaline Phosphatase 313 (H) 38 - 126 U/L   Total Bilirubin 9.2 (H) 0.3 - 1.2 mg/dL   GFR calc non Af Amer >60 >60 mL/min   GFR calc Af Amer >60 >60 mL/min  Comment: (NOTE) The eGFR has been calculated using the CKD EPI equation. This calculation has not been validated in all clinical situations. eGFR's persistently <60 mL/min signify possible Chronic Kidney Disease.    Anion gap 6 5 - 15  CBC     Status: Abnormal   Collection Time: 07/05/16  7:52 PM  Result Value Ref Range   WBC 6.5 4.0 - 10.5 K/uL   RBC 4.34 4.22 - 5.81 MIL/uL   Hemoglobin 12.9 (L) 13.0 - 17.0 g/dL   HCT 37.4 (L) 39.0 - 52.0 %   MCV 86.2 78.0 - 100.0 fL   MCH 29.7 26.0 - 34.0 pg   MCHC 34.5 30.0 - 36.0 g/dL   RDW 15.3 11.5 - 15.5 %   Platelets 185 150 - 400 K/uL   Urinalysis, Routine w reflex microscopic     Status: Abnormal   Collection Time: 07/05/16  9:17 PM  Result Value Ref Range   Color, Urine AMBER (A) YELLOW    Comment: BIOCHEMICALS MAY BE AFFECTED BY COLOR   APPearance CLEAR CLEAR   Specific Gravity, Urine 1.010 1.005 - 1.030   pH 6.0 5.0 - 8.0   Glucose, UA NEGATIVE NEGATIVE mg/dL   Hgb urine dipstick NEGATIVE NEGATIVE   Bilirubin Urine MODERATE (A) NEGATIVE   Ketones, ur NEGATIVE NEGATIVE mg/dL   Protein, ur NEGATIVE NEGATIVE mg/dL   Nitrite NEGATIVE NEGATIVE   Leukocytes, UA NEGATIVE NEGATIVE    Comment: MICROSCOPIC NOT DONE ON URINES WITH NEGATIVE PROTEIN, BLOOD, LEUKOCYTES, NITRITE, OR GLUCOSE <1000 mg/dL.  Surgical PCR screen     Status: None   Collection Time: 07/06/16  2:48 AM  Result Value Ref Range   MRSA, PCR NEGATIVE NEGATIVE   Staphylococcus aureus NEGATIVE NEGATIVE    Comment:        The Xpert SA Assay (FDA approved for NASAL specimens in patients over 70 years of age), is one component of a comprehensive surveillance program.  Test performance has been validated by [facility] for patients greater than or equal to 20 year old. It is not intended to diagnose infection nor to guide or monitor treatment.   Comprehensive metabolic panel     Status: Abnormal   Collection Time: 07/06/16  5:30 AM  Result Value Ref Range   Sodium 139 135 - 145 mmol/L   Potassium 3.8 3.5 - 5.1 mmol/L   Chloride 105 101 - 111 mmol/L   CO2 27 22 - 32 mmol/L   Glucose, Bld 96 65 - 99 mg/dL   BUN 13 6 - 20 mg/dL   Creatinine, Ser 0.60 (L) 0.61 - 1.24 mg/dL   Calcium 9.2 8.9 - 10.3 mg/dL   Total Protein 6.4 (L) 6.5 - 8.1 g/dL   Albumin 3.3 (L) 3.5 - 5.0 g/dL   AST 147 (H) 15 - 41 U/L   ALT 182 (H) 17 - 63 U/L   Alkaline Phosphatase 296 (H) 38 - 126 U/L   Total Bilirubin 10.0 (H) 0.3 - 1.2 mg/dL   GFR calc non Af Amer >60 >60 mL/min   GFR calc Af Amer >60 >60 mL/min    Comment: (NOTE) The eGFR has been calculated using the CKD  EPI equation. This calculation has not been validated in all clinical situations. eGFR's persistently <60 mL/min signify possible Chronic Kidney Disease.    Anion gap 7 5 - 15  CBC     Status: Abnormal   Collection Time: 07/06/16  5:30 AM  Result Value Ref Range  WBC 5.6 4.0 - 10.5 K/uL   RBC 4.25 4.22 - 5.81 MIL/uL   Hemoglobin 12.4 (L) 13.0 - 17.0 g/dL   HCT 36.3 (L) 39.0 - 52.0 %   MCV 85.4 78.0 - 100.0 fL   MCH 29.2 26.0 - 34.0 pg   MCHC 34.2 30.0 - 36.0 g/dL   RDW 15.3 11.5 - 15.5 %   Platelets 169 150 - 400 K/uL  Comprehensive metabolic panel     Status: Abnormal   Collection Time: 07/07/16  5:13 AM  Result Value Ref Range   Sodium 137 135 - 145 mmol/L   Potassium 3.9 3.5 - 5.1 mmol/L   Chloride 105 101 - 111 mmol/L   CO2 26 22 - 32 mmol/L   Glucose, Bld 109 (H) 65 - 99 mg/dL   BUN 15 6 - 20 mg/dL   Creatinine, Ser 0.60 (L) 0.61 - 1.24 mg/dL   Calcium 8.8 (L) 8.9 - 10.3 mg/dL   Total Protein 6.0 (L) 6.5 - 8.1 g/dL   Albumin 3.1 (L) 3.5 - 5.0 g/dL   AST 168 (H) 15 - 41 U/L   ALT 181 (H) 17 - 63 U/L   Alkaline Phosphatase 285 (H) 38 - 126 U/L   Total Bilirubin 11.3 (H) 0.3 - 1.2 mg/dL   GFR calc non Af Amer >60 >60 mL/min   GFR calc Af Amer >60 >60 mL/min    Comment: (NOTE) The eGFR has been calculated using the CKD EPI equation. This calculation has not been validated in all clinical situations. eGFR's persistently <60 mL/min signify possible Chronic Kidney Disease.    Anion gap 6 5 - 15  Protime-INR     Status: None   Collection Time: 07/07/16  5:13 AM  Result Value Ref Range   Prothrombin Time 12.3 11.4 - 15.2 seconds   INR 0.91    Ct Abdomen Pelvis W Contrast  Result Date: 07/05/2016 CLINICAL DATA:  Subacute onset of right lower quadrant abdominal pain and nausea. Initial encounter. EXAM: CT ABDOMEN AND PELVIS WITH CONTRAST TECHNIQUE: Multidetector CT imaging of the abdomen and pelvis was performed using the standard protocol following bolus  administration of intravenous contrast. CONTRAST:  113m ISOVUE-300 IOPAMIDOL (ISOVUE-300) INJECTION 61% COMPARISON:  CT of the abdomen and pelvis from 05/07/2011 FINDINGS: Lower chest: The visualized lung bases are grossly clear. The visualized portions of the mediastinum are unremarkable. Hepatobiliary: There is marked dilatation of the intrahepatic biliary ducts, particularly at the left hepatic lobe, though also to some extent within the inferior right hepatic lobe. On evaluation of coronal images, this is thought to reflect a mass at the proximal common bile duct along the edge of the liver, measuring approximately 2.4 x 1.7 x 1.7 cm, concerning for a cholangiocarcinoma. The common bile duct remains normal in caliber status post cholecystectomy. Clips are noted at the gallbladder fossa. Pancreas: The pancreas is within normal limits. Spleen: The spleen is unremarkable in appearance. Adrenals/Urinary Tract: The adrenal glands are unremarkable in appearance. Scattered bilateral renal cysts are noted, larger on the left. Nonspecific perinephric stranding is noted bilaterally. There is no evidence of hydronephrosis. No renal or ureteral stones are identified. Stomach/Bowel: The stomach is unremarkable in appearance. The small bowel is within normal limits. The patient is status post appendectomy. The colon is unremarkable in appearance. Vascular/Lymphatic: Scattered calcification is seen along the abdominal aorta and its branches. There is slight ectasia of the infrarenal abdominal aorta, without evidence of aneurysmal dilatation. The inferior vena cava is grossly  unremarkable. No retroperitoneal lymphadenopathy is seen. No pelvic sidewall lymphadenopathy is identified. Reproductive: The prostate is mildly enlarged, measuring 5.1 cm in transverse dimension. The bladder is mildly distended and grossly unremarkable. Other: No additional soft tissue abnormalities are seen. Musculoskeletal: No acute osseous  abnormalities are identified. Disc space narrowing is noted along the lower lumbar spine. The visualized musculature is unremarkable in appearance. IMPRESSION: 1. Marked dilatation of the intrahepatic biliary ducts, particularly at the left hepatic lobe, though also to some extent within the inferior right hepatic lobe. On evaluation of coronal images, this is thought to reflect a mass at the proximal common bile duct along the edge of the liver, measuring approximately 2.4 x 1.7 x 1.7 cm, concerning for cholangiocarcinoma. ERCP or MRCP is recommended for further evaluation. 2. Scattered bilateral renal cysts noted. 3. Scattered aortic atherosclerosis. 4. Mildly enlarged prostate. Electronically Signed   By: Garald Balding M.D.   On: 07/05/2016 23:56   Mr 3d Recon At Scanner  Result Date: 07/07/2016 CLINICAL DATA:  75 year old male inpatient with obstructive jaundice, marked intrahepatic biliary ductal dilatation and suggestion of a central biliary mass on CT study from 1 day prior. Total bilirubin 11.3. EXAM: MRI ABDOMEN WITHOUT AND WITH CONTRAST (INCLUDING MRCP) TECHNIQUE: Multiplanar multisequence MR imaging of the abdomen was performed both before and after the administration of intravenous contrast. Heavily T2-weighted images of the biliary and pancreatic ducts were obtained, and three-dimensional MRCP images were rendered by post processing. CONTRAST:  52m MULTIHANCE GADOBENATE DIMEGLUMINE 529 MG/ML IV SOLN COMPARISON:  07/05/2016 CT abdomen/ pelvis. FINDINGS: Lower chest: Mild scarring versus atelectasis in the dependent right lung base. Hepatobiliary: There is relative atrophy of the left liver lobe. Normal size right liver lobe. Minimal diffuse hepatic steatosis. There are 2 subcentimeter simple liver cysts in the anterior liver dome and anterior inferior right liver lobe. There is marked diffuse intrahepatic biliary ductal dilatation, more prominent in the left than right liver lobes. There is a 1.7  x 1.5 x 1.2 cm mass centered at the common bile duct bifurcation (series 1404/ image 47), which demonstrates delayed hyperenhancement. Cholecystectomy. Common bile duct is normal caliber (5 mm diameter) below the central biliary mass. Pancreas: No pancreas divisum. Mildly dilated ventral pancreatic duct up to 4 mm diameter in the pancreatic head. Main pancreatic duct is normal caliber (2 mm diameter). No pancreatic mass. Spleen: Normal size. No mass. Adrenals/Urinary Tract: Normal adrenals. No hydronephrosis. There is an exophytic 3.1 x 2.8 cm renal cortical lesion in the lateral interpolar left kidney (series 4/ image 36), which demonstrates heterogeneous precontrast T1 hyperintensity and no convincing enhancement, consistent with a Bosniak category 2 hemorrhagic/proteinaceous renal cyst. There are 2 additional smaller complex renal cysts in the anterior upper left kidney demonstrating heterogeneous precontrast T1 hyperintensity and no appreciable enhancement, also compatible with Bosniak category 2 hemorrhagic/ proteinaceous renal cysts. Numerous simple renal cysts are present in both kidneys, largest 2.7 cm in the anterior upper left kidney. Stomach/Bowel: Grossly normal stomach. Visualized small and large bowel is normal caliber, with no bowel wall thickening. Vascular/Lymphatic: Atherosclerotic nonaneurysmal abdominal aorta. Patent main portal, right portal, splenic, hepatic and renal veins. There is narrowing of the left portal vein as it courses adjacent to the central biliary mass. No pathologically enlarged lymph nodes in the abdomen. Other: No abdominal ascites or focal fluid collection. Musculoskeletal: No aggressive appearing focal osseous lesions. Small T2 hyperintense bone lesion in the left T11 vertebral body is stable since 2007 CT study, consistent with a  benign lesion, probably a hemangioma. IMPRESSION: 1. Marked diffuse intrahepatic biliary ductal dilatation, most prominent in the left liver lobe,  which demonstrates relative atrophy. Mass at the common bile duct bifurcation measures 1.7 x 1.5 x 1.2 cm and demonstrates delayed hyperenhancement, consistent with a hilar cholangiocarcinoma (Klatskin tumor). 2. Narrowing of the left portal vein as it courses adjacent to the central biliary mass. 3. No evidence of metastatic disease in the abdomen. 4. Additional findings include aortic atherosclerosis, minimal diffuse hepatic steatosis and Bosniak category 1 and category 2 renal cysts. Electronically Signed   By: Ilona Sorrel M.D.   On: 07/07/2016 09:10   Mr Jeananne Rama W/wo Cm/mrcp  Result Date: 07/07/2016 CLINICAL DATA:  75 year old male inpatient with obstructive jaundice, marked intrahepatic biliary ductal dilatation and suggestion of a central biliary mass on CT study from 1 day prior. Total bilirubin 11.3. EXAM: MRI ABDOMEN WITHOUT AND WITH CONTRAST (INCLUDING MRCP) TECHNIQUE: Multiplanar multisequence MR imaging of the abdomen was performed both before and after the administration of intravenous contrast. Heavily T2-weighted images of the biliary and pancreatic ducts were obtained, and three-dimensional MRCP images were rendered by post processing. CONTRAST:  15m MULTIHANCE GADOBENATE DIMEGLUMINE 529 MG/ML IV SOLN COMPARISON:  07/05/2016 CT abdomen/ pelvis. FINDINGS: Lower chest: Mild scarring versus atelectasis in the dependent right lung base. Hepatobiliary: There is relative atrophy of the left liver lobe. Normal size right liver lobe. Minimal diffuse hepatic steatosis. There are 2 subcentimeter simple liver cysts in the anterior liver dome and anterior inferior right liver lobe. There is marked diffuse intrahepatic biliary ductal dilatation, more prominent in the left than right liver lobes. There is a 1.7 x 1.5 x 1.2 cm mass centered at the common bile duct bifurcation (series 1404/ image 47), which demonstrates delayed hyperenhancement. Cholecystectomy. Common bile duct is normal caliber (5 mm diameter)  below the central biliary mass. Pancreas: No pancreas divisum. Mildly dilated ventral pancreatic duct up to 4 mm diameter in the pancreatic head. Main pancreatic duct is normal caliber (2 mm diameter). No pancreatic mass. Spleen: Normal size. No mass. Adrenals/Urinary Tract: Normal adrenals. No hydronephrosis. There is an exophytic 3.1 x 2.8 cm renal cortical lesion in the lateral interpolar left kidney (series 4/ image 36), which demonstrates heterogeneous precontrast T1 hyperintensity and no convincing enhancement, consistent with a Bosniak category 2 hemorrhagic/proteinaceous renal cyst. There are 2 additional smaller complex renal cysts in the anterior upper left kidney demonstrating heterogeneous precontrast T1 hyperintensity and no appreciable enhancement, also compatible with Bosniak category 2 hemorrhagic/ proteinaceous renal cysts. Numerous simple renal cysts are present in both kidneys, largest 2.7 cm in the anterior upper left kidney. Stomach/Bowel: Grossly normal stomach. Visualized small and large bowel is normal caliber, with no bowel wall thickening. Vascular/Lymphatic: Atherosclerotic nonaneurysmal abdominal aorta. Patent main portal, right portal, splenic, hepatic and renal veins. There is narrowing of the left portal vein as it courses adjacent to the central biliary mass. No pathologically enlarged lymph nodes in the abdomen. Other: No abdominal ascites or focal fluid collection. Musculoskeletal: No aggressive appearing focal osseous lesions. Small T2 hyperintense bone lesion in the left T11 vertebral body is stable since 2007 CT study, consistent with a benign lesion, probably a hemangioma. IMPRESSION: 1. Marked diffuse intrahepatic biliary ductal dilatation, most prominent in the left liver lobe, which demonstrates relative atrophy. Mass at the common bile duct bifurcation measures 1.7 x 1.5 x 1.2 cm and demonstrates delayed hyperenhancement, consistent with a hilar cholangiocarcinoma (Klatskin  tumor). 2. Narrowing of the left  portal vein as it courses adjacent to the central biliary mass. 3. No evidence of metastatic disease in the abdomen. 4. Additional findings include aortic atherosclerosis, minimal diffuse hepatic steatosis and Bosniak category 1 and category 2 renal cysts. Electronically Signed   By: Ilona Sorrel M.D.   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 Prior abdominal surgical history includes cholecystectomy and appendectomy. - 3 weeks of progressive worsening orange urine, grey stools, yellow skin, weakness, and right sided abdominal achiness - CT scan showed dilated intrahepatic ducts, mass near biliary bifurcation, normal extrahepatic bile ducts - MRCP showed a mass at the common bile duct bifurcation measures 1.7 x 1.5 x 1.2 cm - lipase 52, bilirubin 11.3, AST/ALT 181/168, alk phos 285, PT/INR 12.3/0.91 - Ca19-9 and CEA pending - Lesion not amenable to ERCP decompression per Gi; Interventional radiology consulted for percutaneous biliary drain(s)   Plan - Will discuss case with Dr. Barry Dienes to see if patient would be a surgical candidate here, or if we will need to refer him to an academic center.  Jerrye Beavers, [facility] Surgery 07/07/2016, 10:57 AM Pager: [phone] Consults: [phone] Mon-Fri 7:00 am-4:30 pm Sat-Sun 7:00 am-11:30 am

## 2016-07-07 NOTE — Care Management Note (Signed)
Case Management Note  Patient Details  Name: Jeremiah Warren MRN: [id number] Date of Birth: Feb 13, 1941  Subjective/Objective:  75 y/o m admitted w/Jaundice. From home.GI cons.                  Action/Plan:d/c plan home.   Expected Discharge Date:                  Expected Discharge Plan:  Home/Self Care  In-House Referral:     Discharge planning Services  CM Consult  Post Acute Care Choice:    Choice offered to:     DME Arranged:    DME Agency:     HH Arranged:    HH Agency:     Status of Service:  In process, will continue to follow  If discussed at Long Length of Stay Meetings, dates discussed:    Additional Comments:  Dessa Phi, RN 07/07/2016, 12:03 PM

## 2016-07-07 NOTE — Progress Notes (Signed)
PROGRESS NOTE    Jeremiah Warren  [id number]  DOB: [birth year]  DOA: 07/05/2016 PCP: Ocie Doyne, MD Outpatient Specialists:   Hospital course: LEVANDER WECKWERTH is a 75 y.o. male healthy comes in with 3 weeks of progressive worsening orange urine along with yellow skin and right sided abdominal dull achiness.  He does not associate the pain with eating or not.  No vomiting.  No nausea.  No weight loss.  CT shows ductal dilation with concern for mass, and his bili is over 11.  Pt referred for admission for biliary obstruction likely due to a mass.   Assessment & Plan:   1. 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  GI consulted IR for percutaneous biliary drain placement.  NPO for now.  2. Elevated LFTs and Hyperbilirubinemia - secondary to obstruction and biliary mass.   3. Nonspecific abdominal pain - symptoms controlled and stable per patient.     DVT prophylaxis: SCDs, holding anticoagulants  Code Status: full Family Communication: wife bedside Disposition Plan: TBD   Consultants:  [facility]  Procedures: MRCP  Antimicrobials:  n/a   Subjective: Pt without complaints.   Objective: Vitals:   07/06/16 0227 07/06/16 1317 07/06/16 2218 07/07/16 0550  BP: (!) 153/81 140/67 (!) 122/58 140/65  Pulse: 67 62 60 65  Resp: 18 16 15 16   Temp: 98.2 F (36.8 C) 98.2 F (36.8 C) 98.4 F (36.9 C) 98 F (36.7 C)  TempSrc: Oral Oral Oral Oral  SpO2: 100% 97% 97% 98%  Weight: 66.1 kg (145 lb 11.6 oz)     Height: 5\' 7"  (1.702 m)       Intake/Output Summary (Last 24 hours) at 07/07/16 1113 Last data filed at 07/07/16 1030  Gross per 24 hour  Intake              600 ml  Output              701 ml  Net             -101 ml   Filed Weights   07/05/16 1921 07/06/16 0227  Weight: 60 kg (132 lb 4 oz) 66.1 kg (145 lb 11.6 oz)    Exam:  General exam: well developed, well nourished, no distress, cooperative, icteric sclera  bilateral Respiratory system: Clear. No increased work of breathing. Cardiovascular system: S1 & S2 heard, RRR. No JVD, murmurs, gallops, clicks or pedal edema. Gastrointestinal system: Abdomen is nondistended, soft and nontender. Normal bowel sounds heard. Central nervous system: Alert and oriented. No focal neurological deficits. Extremities: no CCE.  Data Reviewed: Basic Metabolic Panel:  Recent Labs Lab 07/05/16 1952 07/06/16 0530 07/07/16 0513  NA 138 139 137  K 3.9 3.8 3.9  CL 103 105 105  CO2 29 27 26   GLUCOSE 92 96 109*  BUN 13 13 15   CREATININE 0.74 0.60* 0.60*  CALCIUM 9.4 9.2 8.8*   Liver Function Tests:  Recent Labs Lab 07/05/16 1952 07/06/16 0530 07/07/16 0513  AST 153* 147* 168*  ALT 195* 182* 181*  ALKPHOS 313* 296* 285*  BILITOT 9.2* 10.0* 11.3*  PROT 7.1 6.4* 6.0*  ALBUMIN 3.7 3.3* 3.1*    Recent Labs Lab 07/05/16 1952  LIPASE 52*   No results for input(s): AMMONIA in the last 168 hours. CBC:  Recent Labs Lab 07/05/16 1952 07/06/16 0530  WBC 6.5 5.6  HGB 12.9* 12.4*  HCT 37.4* 36.3*  MCV 86.2 85.4  PLT 185 169  Cardiac Enzymes: No results for input(s): CKTOTAL, CKMB, CKMBINDEX, TROPONINI in the last 168 hours. CBG (last 3)  No results for input(s): GLUCAP in the last 72 hours. Recent Results (from the past 240 hour(s))  Surgical PCR screen     Status: None   Collection Time: 07/06/16  2:48 AM  Result Value Ref Range Status   MRSA, PCR NEGATIVE NEGATIVE Final   Staphylococcus aureus NEGATIVE NEGATIVE Final    Comment:        The Xpert SA Assay (FDA approved for NASAL specimens in patients over 51 years of age), is one component of a comprehensive surveillance program.  Test performance has been validated by [facility] for patients greater than or equal to 70 year old. It is not intended to diagnose infection nor to guide or monitor treatment.      Studies: Ct Abdomen Pelvis W Contrast  Result Date:  07/05/2016 CLINICAL DATA:  Subacute onset of right lower quadrant abdominal pain and nausea. Initial encounter. EXAM: CT ABDOMEN AND PELVIS WITH CONTRAST TECHNIQUE: Multidetector CT imaging of the abdomen and pelvis was performed using the standard protocol following bolus administration of intravenous contrast. CONTRAST:  132mL ISOVUE-300 IOPAMIDOL (ISOVUE-300) INJECTION 61% COMPARISON:  CT of the abdomen and pelvis from 05/07/2011 FINDINGS: Lower chest: The visualized lung bases are grossly clear. The visualized portions of the mediastinum are unremarkable. Hepatobiliary: There is marked dilatation of the intrahepatic biliary ducts, particularly at the left hepatic lobe, though also to some extent within the inferior right hepatic lobe. On evaluation of coronal images, this is thought to reflect a mass at the proximal common bile duct along the edge of the liver, measuring approximately 2.4 x 1.7 x 1.7 cm, concerning for a cholangiocarcinoma. The common bile duct remains normal in caliber status post cholecystectomy. Clips are noted at the gallbladder fossa. Pancreas: The pancreas is within normal limits. Spleen: The spleen is unremarkable in appearance. Adrenals/Urinary Tract: The adrenal glands are unremarkable in appearance. Scattered bilateral renal cysts are noted, larger on the left. Nonspecific perinephric stranding is noted bilaterally. There is no evidence of hydronephrosis. No renal or ureteral stones are identified. Stomach/Bowel: The stomach is unremarkable in appearance. The small bowel is within normal limits. The patient is status post appendectomy. The colon is unremarkable in appearance. Vascular/Lymphatic: Scattered calcification is seen along the abdominal aorta and its branches. There is slight ectasia of the infrarenal abdominal aorta, without evidence of aneurysmal dilatation. The inferior vena cava is grossly unremarkable. No retroperitoneal lymphadenopathy is seen. No pelvic sidewall  lymphadenopathy is identified. Reproductive: The prostate is mildly enlarged, measuring 5.1 cm in transverse dimension. The bladder is mildly distended and grossly unremarkable. Other: No additional soft tissue abnormalities are seen. Musculoskeletal: No acute osseous abnormalities are identified. Disc space narrowing is noted along the lower lumbar spine. The visualized musculature is unremarkable in appearance. IMPRESSION: 1. Marked dilatation of the intrahepatic biliary ducts, particularly at the left hepatic lobe, though also to some extent within the inferior right hepatic lobe. On evaluation of coronal images, this is thought to reflect a mass at the proximal common bile duct along the edge of the liver, measuring approximately 2.4 x 1.7 x 1.7 cm, concerning for cholangiocarcinoma. ERCP or MRCP is recommended for further evaluation. 2. Scattered bilateral renal cysts noted. 3. Scattered aortic atherosclerosis. 4. Mildly enlarged prostate. Electronically Signed   By: Garald Balding M.D.   On: 07/05/2016 23:56   Mr 3d Recon At Scanner  Result Date: 07/07/2016  CLINICAL DATA:  75 year old male inpatient with obstructive jaundice, marked intrahepatic biliary ductal dilatation and suggestion of a central biliary mass on CT study from 1 day prior. Total bilirubin 11.3. EXAM: MRI ABDOMEN WITHOUT AND WITH CONTRAST (INCLUDING MRCP) TECHNIQUE: Multiplanar multisequence MR imaging of the abdomen was performed both before and after the administration of intravenous contrast. Heavily T2-weighted images of the biliary and pancreatic ducts were obtained, and three-dimensional MRCP images were rendered by post processing. CONTRAST:  18mL MULTIHANCE GADOBENATE DIMEGLUMINE 529 MG/ML IV SOLN COMPARISON:  07/05/2016 CT abdomen/ pelvis. FINDINGS: Lower chest: Mild scarring versus atelectasis in the dependent right lung base. Hepatobiliary: There is relative atrophy of the left liver lobe. Normal size right liver lobe. Minimal  diffuse hepatic steatosis. There are 2 subcentimeter simple liver cysts in the anterior liver dome and anterior inferior right liver lobe. There is marked diffuse intrahepatic biliary ductal dilatation, more prominent in the left than right liver lobes. There is a 1.7 x 1.5 x 1.2 cm mass centered at the common bile duct bifurcation (series 1404/ image 47), which demonstrates delayed hyperenhancement. Cholecystectomy. Common bile duct is normal caliber (5 mm diameter) below the central biliary mass. Pancreas: No pancreas divisum. Mildly dilated ventral pancreatic duct up to 4 mm diameter in the pancreatic head. Main pancreatic duct is normal caliber (2 mm diameter). No pancreatic mass. Spleen: Normal size. No mass. Adrenals/Urinary Tract: Normal adrenals. No hydronephrosis. There is an exophytic 3.1 x 2.8 cm renal cortical lesion in the lateral interpolar left kidney (series 4/ image 36), which demonstrates heterogeneous precontrast T1 hyperintensity and no convincing enhancement, consistent with a Bosniak category 2 hemorrhagic/proteinaceous renal cyst. There are 2 additional smaller complex renal cysts in the anterior upper left kidney demonstrating heterogeneous precontrast T1 hyperintensity and no appreciable enhancement, also compatible with Bosniak category 2 hemorrhagic/ proteinaceous renal cysts. Numerous simple renal cysts are present in both kidneys, largest 2.7 cm in the anterior upper left kidney. Stomach/Bowel: Grossly normal stomach. Visualized small and large bowel is normal caliber, with no bowel wall thickening. Vascular/Lymphatic: Atherosclerotic nonaneurysmal abdominal aorta. Patent main portal, right portal, splenic, hepatic and renal veins. There is narrowing of the left portal vein as it courses adjacent to the central biliary mass. No pathologically enlarged lymph nodes in the abdomen. Other: No abdominal ascites or focal fluid collection. Musculoskeletal: No aggressive appearing focal osseous  lesions. Small T2 hyperintense bone lesion in the left T11 vertebral body is stable since 2007 CT study, consistent with a benign lesion, probably a hemangioma. IMPRESSION: 1. Marked diffuse intrahepatic biliary ductal dilatation, most prominent in the left liver lobe, which demonstrates relative atrophy. Mass at the common bile duct bifurcation measures 1.7 x 1.5 x 1.2 cm and demonstrates delayed hyperenhancement, consistent with a hilar cholangiocarcinoma (Klatskin tumor). 2. Narrowing of the left portal vein as it courses adjacent to the central biliary mass. 3. No evidence of metastatic disease in the abdomen. 4. Additional findings include aortic atherosclerosis, minimal diffuse hepatic steatosis and Bosniak category 1 and category 2 renal cysts. Electronically Signed   By: Ilona Sorrel M.D.   On: 07/07/2016 09:10   Mr Jeananne Rama W/wo Cm/mrcp  Result Date: 07/07/2016 CLINICAL DATA:  75 year old male inpatient with obstructive jaundice, marked intrahepatic biliary ductal dilatation and suggestion of a central biliary mass on CT study from 1 day prior. Total bilirubin 11.3. EXAM: MRI ABDOMEN WITHOUT AND WITH CONTRAST (INCLUDING MRCP) TECHNIQUE: Multiplanar multisequence MR imaging of the abdomen was performed both before and after the  administration of intravenous contrast. Heavily T2-weighted images of the biliary and pancreatic ducts were obtained, and three-dimensional MRCP images were rendered by post processing. CONTRAST:  41mL MULTIHANCE GADOBENATE DIMEGLUMINE 529 MG/ML IV SOLN COMPARISON:  07/05/2016 CT abdomen/ pelvis. FINDINGS: Lower chest: Mild scarring versus atelectasis in the dependent right lung base. Hepatobiliary: There is relative atrophy of the left liver lobe. Normal size right liver lobe. Minimal diffuse hepatic steatosis. There are 2 subcentimeter simple liver cysts in the anterior liver dome and anterior inferior right liver lobe. There is marked diffuse intrahepatic biliary ductal dilatation,  more prominent in the left than right liver lobes. There is a 1.7 x 1.5 x 1.2 cm mass centered at the common bile duct bifurcation (series 1404/ image 47), which demonstrates delayed hyperenhancement. Cholecystectomy. Common bile duct is normal caliber (5 mm diameter) below the central biliary mass. Pancreas: No pancreas divisum. Mildly dilated ventral pancreatic duct up to 4 mm diameter in the pancreatic head. Main pancreatic duct is normal caliber (2 mm diameter). No pancreatic mass. Spleen: Normal size. No mass. Adrenals/Urinary Tract: Normal adrenals. No hydronephrosis. There is an exophytic 3.1 x 2.8 cm renal cortical lesion in the lateral interpolar left kidney (series 4/ image 36), which demonstrates heterogeneous precontrast T1 hyperintensity and no convincing enhancement, consistent with a Bosniak category 2 hemorrhagic/proteinaceous renal cyst. There are 2 additional smaller complex renal cysts in the anterior upper left kidney demonstrating heterogeneous precontrast T1 hyperintensity and no appreciable enhancement, also compatible with Bosniak category 2 hemorrhagic/ proteinaceous renal cysts. Numerous simple renal cysts are present in both kidneys, largest 2.7 cm in the anterior upper left kidney. Stomach/Bowel: Grossly normal stomach. Visualized small and large bowel is normal caliber, with no bowel wall thickening. Vascular/Lymphatic: Atherosclerotic nonaneurysmal abdominal aorta. Patent main portal, right portal, splenic, hepatic and renal veins. There is narrowing of the left portal vein as it courses adjacent to the central biliary mass. No pathologically enlarged lymph nodes in the abdomen. Other: No abdominal ascites or focal fluid collection. Musculoskeletal: No aggressive appearing focal osseous lesions. Small T2 hyperintense bone lesion in the left T11 vertebral body is stable since 2007 CT study, consistent with a benign lesion, probably a hemangioma. IMPRESSION: 1. Marked diffuse intrahepatic  biliary ductal dilatation, most prominent in the left liver lobe, which demonstrates relative atrophy. Mass at the common bile duct bifurcation measures 1.7 x 1.5 x 1.2 cm and demonstrates delayed hyperenhancement, consistent with a hilar cholangiocarcinoma (Klatskin tumor). 2. Narrowing of the left portal vein as it courses adjacent to the central biliary mass. 3. No evidence of metastatic disease in the abdomen. 4. Additional findings include aortic atherosclerosis, minimal diffuse hepatic steatosis and Bosniak category 1 and category 2 renal cysts. Electronically Signed   By: Ilona Sorrel M.D.   On: 07/07/2016 09:10   Scheduled Meds: . cholestyramine  4 g Oral BID  . Influenza vac split quadrivalent PF  0.5 mL Intramuscular Tomorrow-1000   Continuous Infusions:   Principal Problem:   Jaundice of recent onset Active Problems:   Abdominal pain, acute, right upper quadrant   Common bile duct (CBD) obstruction   Common bile duct mass   Obstructive jaundice   Abnormal CT scan  Time spent:   Irwin Brakeman, MD, FAAFP [facility] Pager [phone]  If 7PM-7AM, please contact night-coverage [email or web address] Password [email or web address] 07/07/2016, 11:13 AM    LOS: 1 day

## 2016-07-07 NOTE — Consult Note (Signed)
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 Arta Silence  Supervising Physician: Daryll Brod  Patient Status: In-pt   HPI: Jeremiah Warren is an 75 y.o. male who began noticing over the last couple of weeks that he was having orange colored urine, clay colored stools, and jaundice.  He was not having any abdominal pain.  He had no other symptoms.  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  He was noted to have obstructive jaundice as well.  He has subsequently had an MRCP which confirms this as well.  Due to it's locations, GI does not feel they can perform an ERCP and place a stent to relieve this obstruction.  We have been asked to see the patient for a biliary drain placement.  Past Medical History: History reviewed. No pertinent past medical history.  Past Surgical History:  Past Surgical History:  Procedure Laterality Date  . APPENDECTOMY    . BACK SURGERY    . CHOLECYSTECTOMY      Family History: History reviewed. No pertinent family history.  Social History:  reports that he has never smoked. He has never used smokeless tobacco. He reports that he does not drink alcohol. His drug history is not on file.  Allergies:  Allergies  Allergen Reactions  . Percocet [Oxycodone-Acetaminophen] Anxiety    Medications: Medications reviewed in Epic  Please HPI for pertinent positives, otherwise complete 10 system ROS negative.  Mallampati Score: MD Evaluation Airway: WNL Heart: WNL Abdomen: WNL Chest/ Lungs: WNL ASA  Classification: 2 Mallampati/Airway Score: Three  Physical Exam: BP 140/65 (BP Location: Right Arm)   Pulse 65   Temp 98 F (36.7 C) (Oral)   Resp 16   Ht '5\' 7"'$  (1.702 m)   Wt 145 lb 11.6 oz (66.1 kg)   SpO2 98%   BMI 22.82 kg/m  Body mass index is 22.82 kg/m.  General: pleasant, WD, WN white male who is laying in bed in NAD HEENT: head is normocephalic, atraumatic.  Sclera are  icteric.  PERRL.  Ears and nose without any masses or lesions.  Mouth is pink and moist Heart: regular, rate, and rhythm.  Normal s1,s2. No obvious murmurs, gallops, or rubs noted.  Palpable radial and pedal pulses bilaterally Lungs: CTAB, no wheezes, rhonchi, or rales noted.  Respiratory effort nonlabored Abd: soft, minimally tender in suprapubic region, ND, +BS, no masses, hernias, or organomegaly Skin: warm and dry with no masses, lesions, or rashes, skin is jaundice Psych: A&Ox3 with an appropriate affect.   Labs: Results for orders placed or performed during the hospital encounter of 07/05/16 (from the past 48 hour(s))  Lipase, blood     Status: Abnormal   Collection Time: 07/05/16  7:52 PM  Result Value Ref Range   Lipase 52 (H) 11 - 51 U/L  Comprehensive metabolic panel     Status: Abnormal   Collection Time: 07/05/16  7:52 PM  Result Value Ref Range   Sodium 138 135 - 145 mmol/L   Potassium 3.9 3.5 - 5.1 mmol/L   Chloride 103 101 - 111 mmol/L   CO2 29 22 - 32 mmol/L   Glucose, Bld 92 65 - 99 mg/dL   BUN 13 6 - 20 mg/dL   Creatinine, Ser 0.74 0.61 - 1.24 mg/dL   Calcium 9.4 8.9 - 10.3 mg/dL   Total Protein 7.1 6.5 - 8.1 g/dL   Albumin 3.7 3.5 - 5.0 g/dL   AST  153 (H) 15 - 41 U/L   ALT 195 (H) 17 - 63 U/L   Alkaline Phosphatase 313 (H) 38 - 126 U/L   Total Bilirubin 9.2 (H) 0.3 - 1.2 mg/dL   GFR calc non Af Amer >60 >60 mL/min   GFR calc Af Amer >60 >60 mL/min    Comment: (NOTE) The eGFR has been calculated using the CKD EPI equation. This calculation has not been validated in all clinical situations. eGFR's persistently <60 mL/min signify possible Chronic Kidney Disease.    Anion gap 6 5 - 15  CBC     Status: Abnormal   Collection Time: 07/05/16  7:52 PM  Result Value Ref Range   WBC 6.5 4.0 - 10.5 K/uL   RBC 4.34 4.22 - 5.81 MIL/uL   Hemoglobin 12.9 (L) 13.0 - 17.0 g/dL   HCT 37.4 (L) 39.0 - 52.0 %   MCV 86.2 78.0 - 100.0 fL   MCH 29.7 26.0 - 34.0 pg   MCHC  34.5 30.0 - 36.0 g/dL   RDW 15.3 11.5 - 15.5 %   Platelets 185 150 - 400 K/uL  Urinalysis, Routine w reflex microscopic     Status: Abnormal   Collection Time: 07/05/16  9:17 PM  Result Value Ref Range   Color, Urine AMBER (A) YELLOW    Comment: BIOCHEMICALS MAY BE AFFECTED BY COLOR   APPearance CLEAR CLEAR   Specific Gravity, Urine 1.010 1.005 - 1.030   pH 6.0 5.0 - 8.0   Glucose, UA NEGATIVE NEGATIVE mg/dL   Hgb urine dipstick NEGATIVE NEGATIVE   Bilirubin Urine MODERATE (A) NEGATIVE   Ketones, ur NEGATIVE NEGATIVE mg/dL   Protein, ur NEGATIVE NEGATIVE mg/dL   Nitrite NEGATIVE NEGATIVE   Leukocytes, UA NEGATIVE NEGATIVE    Comment: MICROSCOPIC NOT DONE ON URINES WITH NEGATIVE PROTEIN, BLOOD, LEUKOCYTES, NITRITE, OR GLUCOSE <1000 mg/dL.  Surgical PCR screen     Status: None   Collection Time: 07/06/16  2:48 AM  Result Value Ref Range   MRSA, PCR NEGATIVE NEGATIVE   Staphylococcus aureus NEGATIVE NEGATIVE    Comment:        The Xpert SA Assay (FDA approved for NASAL specimens in patients over 31 years of age), is one component of a comprehensive surveillance program.  Test performance has been validated by [facility] for patients greater than or equal to 62 year old. It is not intended to diagnose infection nor to guide or monitor treatment.   Comprehensive metabolic panel     Status: Abnormal   Collection Time: 07/06/16  5:30 AM  Result Value Ref Range   Sodium 139 135 - 145 mmol/L   Potassium 3.8 3.5 - 5.1 mmol/L   Chloride 105 101 - 111 mmol/L   CO2 27 22 - 32 mmol/L   Glucose, Bld 96 65 - 99 mg/dL   BUN 13 6 - 20 mg/dL   Creatinine, Ser 0.60 (L) 0.61 - 1.24 mg/dL   Calcium 9.2 8.9 - 10.3 mg/dL   Total Protein 6.4 (L) 6.5 - 8.1 g/dL   Albumin 3.3 (L) 3.5 - 5.0 g/dL   AST 147 (H) 15 - 41 U/L   ALT 182 (H) 17 - 63 U/L   Alkaline Phosphatase 296 (H) 38 - 126 U/L   Total Bilirubin 10.0 (H) 0.3 - 1.2 mg/dL   GFR calc non Af Amer >60 >60 mL/min   GFR calc Af Amer  >60 >60 mL/min    Comment: (NOTE) The eGFR has been  calculated using the CKD EPI equation. This calculation has not been validated in all clinical situations. eGFR's persistently <60 mL/min signify possible Chronic Kidney Disease.    Anion gap 7 5 - 15  CBC     Status: Abnormal   Collection Time: 07/06/16  5:30 AM  Result Value Ref Range   WBC 5.6 4.0 - 10.5 K/uL   RBC 4.25 4.22 - 5.81 MIL/uL   Hemoglobin 12.4 (L) 13.0 - 17.0 g/dL   HCT 14.1 (L) 03.0 - 13.1 %   MCV 85.4 78.0 - 100.0 fL   MCH 29.2 26.0 - 34.0 pg   MCHC 34.2 30.0 - 36.0 g/dL   RDW 43.8 88.7 - 57.9 %   Platelets 169 150 - 400 K/uL  Comprehensive metabolic panel     Status: Abnormal   Collection Time: 07/07/16  5:13 AM  Result Value Ref Range   Sodium 137 135 - 145 mmol/L   Potassium 3.9 3.5 - 5.1 mmol/L   Chloride 105 101 - 111 mmol/L   CO2 26 22 - 32 mmol/L   Glucose, Bld 109 (H) 65 - 99 mg/dL   BUN 15 6 - 20 mg/dL   Creatinine, Ser 7.28 (L) 0.61 - 1.24 mg/dL   Calcium 8.8 (L) 8.9 - 10.3 mg/dL   Total Protein 6.0 (L) 6.5 - 8.1 g/dL   Albumin 3.1 (L) 3.5 - 5.0 g/dL   AST 206 (H) 15 - 41 U/L   ALT 181 (H) 17 - 63 U/L   Alkaline Phosphatase 285 (H) 38 - 126 U/L   Total Bilirubin 11.3 (H) 0.3 - 1.2 mg/dL   GFR calc non Af Amer >60 >60 mL/min   GFR calc Af Amer >60 >60 mL/min    Comment: (NOTE) The eGFR has been calculated using the CKD EPI equation. This calculation has not been validated in all clinical situations. eGFR's persistently <60 mL/min signify possible Chronic Kidney Disease.    Anion gap 6 5 - 15  Protime-INR     Status: None   Collection Time: 07/07/16  5:13 AM  Result Value Ref Range   Prothrombin Time 12.3 11.4 - 15.2 seconds   INR 0.91     Imaging: Ct Abdomen Pelvis W Contrast  Result Date: 07/05/2016 CLINICAL DATA:  Subacute onset of right lower quadrant abdominal pain and nausea. Initial encounter. EXAM: CT ABDOMEN AND PELVIS WITH CONTRAST TECHNIQUE: Multidetector CT imaging of the  abdomen and pelvis was performed using the standard protocol following bolus administration of intravenous contrast. CONTRAST:  ISOVUE-300 IOPAMIDOL (ISOVUE-300) INJECTION 61% COMPARISON:  CT of the abdomen and pelvis from 05/07/2011 FINDINGS: Lower chest: The visualized lung bases are grossly clear. The visualized portions of the mediastinum are unremarkable. Hepatobiliary: There is marked dilatation of the intrahepatic biliary ducts, particularly at the left hepatic lobe, though also to some extent within the inferior right hepatic lobe. On evaluation of coronal images, this is thought to reflect a mass at the proximal common bile duct along the edge of the liver, measuring approximately 2.4 x 1.7 x 1.7 cm, concerning for a cholangiocarcinoma. The common bile duct remains normal in caliber status post cholecystectomy. Clips are noted at the gallbladder fossa. Pancreas: The pancreas is within normal limits. Spleen: The spleen is unremarkable in appearance. Adrenals/Urinary Tract: The adrenal glands are unremarkable in appearance. Scattered bilateral renal cysts are noted, larger on the left. Nonspecific perinephric stranding is noted bilaterally. There is no evidence of hydronephrosis. No renal or ureteral stones are  identified. Stomach/Bowel: The stomach is unremarkable in appearance. The small bowel is within normal limits. The patient is status post appendectomy. The colon is unremarkable in appearance. Vascular/Lymphatic: Scattered calcification is seen along the abdominal aorta and its branches. There is slight ectasia of the infrarenal abdominal aorta, without evidence of aneurysmal dilatation. The inferior vena cava is grossly unremarkable. No retroperitoneal lymphadenopathy is seen. No pelvic sidewall lymphadenopathy is identified. Reproductive: The prostate is mildly enlarged, measuring 5.1 cm in transverse dimension. The bladder is mildly distended and grossly unremarkable. Other: No additional soft  tissue abnormalities are seen. Musculoskeletal: No acute osseous abnormalities are identified. Disc space narrowing is noted along the lower lumbar spine. The visualized musculature is unremarkable in appearance. IMPRESSION: 1. Marked dilatation of the intrahepatic biliary ducts, particularly at the left hepatic lobe, though also to some extent within the inferior right hepatic lobe. On evaluation of coronal images, this is thought to reflect a mass at the proximal common bile duct along the edge of the liver, measuring approximately 2.4 x 1.7 x 1.7 cm, concerning for cholangiocarcinoma. ERCP or MRCP is recommended for further evaluation. 2. Scattered bilateral renal cysts noted. 3. Scattered aortic atherosclerosis. 4. Mildly enlarged prostate. Electronically Signed   By: Garald Balding M.D.   On: 07/05/2016 23:56   Mr 3d Recon At Scanner  Result Date: 07/07/2016 CLINICAL DATA:  75 year old male inpatient with obstructive jaundice, marked intrahepatic biliary ductal dilatation and suggestion of a central biliary mass on CT study from 1 day prior. Total bilirubin 11.3. EXAM: MRI ABDOMEN WITHOUT AND WITH CONTRAST (INCLUDING MRCP) TECHNIQUE: Multiplanar multisequence MR imaging of the abdomen was performed both before and after the administration of intravenous contrast. Heavily T2-weighted images of the biliary and pancreatic ducts were obtained, and three-dimensional MRCP images were rendered by post processing. CONTRAST:  35m MULTIHANCE GADOBENATE DIMEGLUMINE 529 MG/ML IV SOLN COMPARISON:  07/05/2016 CT abdomen/ pelvis. FINDINGS: Lower chest: Mild scarring versus atelectasis in the dependent right lung base. Hepatobiliary: There is relative atrophy of the left liver lobe. Normal size right liver lobe. Minimal diffuse hepatic steatosis. There are 2 subcentimeter simple liver cysts in the anterior liver dome and anterior inferior right liver lobe. There is marked diffuse intrahepatic biliary ductal dilatation,  more prominent in the left than right liver lobes. There is a 1.7 x 1.5 x 1.2 cm mass centered at the common bile duct bifurcation (series 1404/ image 47), which demonstrates delayed hyperenhancement. Cholecystectomy. Common bile duct is normal caliber (5 mm diameter) below the central biliary mass. Pancreas: No pancreas divisum. Mildly dilated ventral pancreatic duct up to 4 mm diameter in the pancreatic head. Main pancreatic duct is normal caliber (2 mm diameter). No pancreatic mass. Spleen: Normal size. No mass. Adrenals/Urinary Tract: Normal adrenals. No hydronephrosis. There is an exophytic 3.1 x 2.8 cm renal cortical lesion in the lateral interpolar left kidney (series 4/ image 36), which demonstrates heterogeneous precontrast T1 hyperintensity and no convincing enhancement, consistent with a Bosniak category 2 hemorrhagic/proteinaceous renal cyst. There are 2 additional smaller complex renal cysts in the anterior upper left kidney demonstrating heterogeneous precontrast T1 hyperintensity and no appreciable enhancement, also compatible with Bosniak category 2 hemorrhagic/ proteinaceous renal cysts. Numerous simple renal cysts are present in both kidneys, largest 2.7 cm in the anterior upper left kidney. Stomach/Bowel: Grossly normal stomach. Visualized small and large bowel is normal caliber, with no bowel wall thickening. Vascular/Lymphatic: Atherosclerotic nonaneurysmal abdominal aorta. Patent main portal, right portal, splenic, hepatic and renal veins. There  is narrowing of the left portal vein as it courses adjacent to the central biliary mass. No pathologically enlarged lymph nodes in the abdomen. Other: No abdominal ascites or focal fluid collection. Musculoskeletal: No aggressive appearing focal osseous lesions. Small T2 hyperintense bone lesion in the left T11 vertebral body is stable since 2007 CT study, consistent with a benign lesion, probably a hemangioma. IMPRESSION: 1. Marked diffuse intrahepatic  biliary ductal dilatation, most prominent in the left liver lobe, which demonstrates relative atrophy. Mass at the common bile duct bifurcation measures 1.7 x 1.5 x 1.2 cm and demonstrates delayed hyperenhancement, consistent with a hilar cholangiocarcinoma (Klatskin tumor). 2. Narrowing of the left portal vein as it courses adjacent to the central biliary mass. 3. No evidence of metastatic disease in the abdomen. 4. Additional findings include aortic atherosclerosis, minimal diffuse hepatic steatosis and Bosniak category 1 and category 2 renal cysts. Electronically Signed   By: Ilona Sorrel M.D.   On: 07/07/2016 09:10   Mr Jeananne Rama W/wo Cm/mrcp  Result Date: 07/07/2016 CLINICAL DATA:  75 year old male inpatient with obstructive jaundice, marked intrahepatic biliary ductal dilatation and suggestion of a central biliary mass on CT study from 1 day prior. Total bilirubin 11.3. EXAM: MRI ABDOMEN WITHOUT AND WITH CONTRAST (INCLUDING MRCP) TECHNIQUE: Multiplanar multisequence MR imaging of the abdomen was performed both before and after the administration of intravenous contrast. Heavily T2-weighted images of the biliary and pancreatic ducts were obtained, and three-dimensional MRCP images were rendered by post processing. CONTRAST:  82m MULTIHANCE GADOBENATE DIMEGLUMINE 529 MG/ML IV SOLN COMPARISON:  07/05/2016 CT abdomen/ pelvis. FINDINGS: Lower chest: Mild scarring versus atelectasis in the dependent right lung base. Hepatobiliary: There is relative atrophy of the left liver lobe. Normal size right liver lobe. Minimal diffuse hepatic steatosis. There are 2 subcentimeter simple liver cysts in the anterior liver dome and anterior inferior right liver lobe. There is marked diffuse intrahepatic biliary ductal dilatation, more prominent in the left than right liver lobes. There is a 1.7 x 1.5 x 1.2 cm mass centered at the common bile duct bifurcation (series 1404/ image 47), which demonstrates delayed hyperenhancement.  Cholecystectomy. Common bile duct is normal caliber (5 mm diameter) below the central biliary mass. Pancreas: No pancreas divisum. Mildly dilated ventral pancreatic duct up to 4 mm diameter in the pancreatic head. Main pancreatic duct is normal caliber (2 mm diameter). No pancreatic mass. Spleen: Normal size. No mass. Adrenals/Urinary Tract: Normal adrenals. No hydronephrosis. There is an exophytic 3.1 x 2.8 cm renal cortical lesion in the lateral interpolar left kidney (series 4/ image 36), which demonstrates heterogeneous precontrast T1 hyperintensity and no convincing enhancement, consistent with a Bosniak category 2 hemorrhagic/proteinaceous renal cyst. There are 2 additional smaller complex renal cysts in the anterior upper left kidney demonstrating heterogeneous precontrast T1 hyperintensity and no appreciable enhancement, also compatible with Bosniak category 2 hemorrhagic/ proteinaceous renal cysts. Numerous simple renal cysts are present in both kidneys, largest 2.7 cm in the anterior upper left kidney. Stomach/Bowel: Grossly normal stomach. Visualized small and large bowel is normal caliber, with no bowel wall thickening. Vascular/Lymphatic: Atherosclerotic nonaneurysmal abdominal aorta. Patent main portal, right portal, splenic, hepatic and renal veins. There is narrowing of the left portal vein as it courses adjacent to the central biliary mass. No pathologically enlarged lymph nodes in the abdomen. Other: No abdominal ascites or focal fluid collection. Musculoskeletal: No aggressive appearing focal osseous lesions. Small T2 hyperintense bone lesion in the left T11 vertebral body is stable since 2007 CT  study, consistent with a benign lesion, probably a hemangioma. IMPRESSION: 1. Marked diffuse intrahepatic biliary ductal dilatation, most prominent in the left liver lobe, which demonstrates relative atrophy. Mass at the common bile duct bifurcation measures 1.7 x 1.5 x 1.2 cm and demonstrates delayed  hyperenhancement, consistent with a hilar cholangiocarcinoma (Klatskin tumor). 2. Narrowing of the left portal vein as it courses adjacent to the central biliary mass. 3. No evidence of metastatic disease in the abdomen. 4. Additional findings include aortic atherosclerosis, minimal diffuse hepatic steatosis and Bosniak category 1 and category 2 renal cysts. Electronically Signed   By: Ilona Sorrel M.D.   On: 07/07/2016 09:10    Assessment/Plan 1. Klatskin's tumor with biliary obstruction -we will plan to proceed today with a percutaneous biliary drain placement -labs and vitals have been reviewed -his Mallampati is a 3 and this has been d/w Dr. Annamaria Boots who does not feel as if an anesthesia consult is warranted at this time.  He is not obese, does not have a history of OSA, or other pulmonary problems -Risks and Benefits discussed with the patient including bleeding, infection, damage to adjacent structures, and sepsis. All of the patient's questions were answered, patient is agreeable to proceed. Consent signed and in chart.  Thank you for this interesting consult.  I greatly enjoyed meeting KHALEB BROZ and look forward to participating in their care.  A copy of this report was sent to the requesting provider on this date.  Electronically Signed: Henreitta Cea 07/07/2016, 12:04 PM   I spent a total of 40 Minutes    in face to face in clinical consultation, greater than 50% of which was counseling/coordinating care for klatskin's tumor, biliary obstruction

## 2016-07-07 NOTE — Progress Notes (Signed)
Subjective: Right-sided soreness. Itching.  Objective: Vital signs in last 24 hours: Temp:  [98 F (36.7 C)-98.4 F (36.9 C)] 98 F (36.7 C) (10/12 0550) Pulse Rate:  [60-65] 65 (10/12 0550) Resp:  [15-16] 16 (10/12 0550) BP: (122-140)/(58-67) 140/65 (10/12 0550) SpO2:  [97 %-98 %] 98 % (10/12 0550) Weight:  [65.8 kg (145 lb)] 65.8 kg (145 lb) (10/11 2120) Weight change:  Last BM Date: 07/06/16  PE: GEN:  Much younger-appearing than stated age, NAD ABD:  Soft, mild right-sided tenderness without peritonitis  Lab Results: CBC    Component Value Date/Time   WBC 5.6 07/06/2016 0530   RBC 4.25 07/06/2016 0530   HGB 12.4 (L) 07/06/2016 0530   HCT 36.3 (L) 07/06/2016 0530   PLT 169 07/06/2016 0530   MCV 85.4 07/06/2016 0530   MCH 29.2 07/06/2016 0530   MCHC 34.2 07/06/2016 0530   RDW 15.3 07/06/2016 0530   CMP     Component Value Date/Time   NA 137 07/07/2016 0513   K 3.9 07/07/2016 0513   CL 105 07/07/2016 0513   CO2 26 07/07/2016 0513   GLUCOSE 109 (H) 07/07/2016 0513   BUN 15 07/07/2016 0513   CREATININE 0.60 (L) 07/07/2016 0513   CALCIUM 8.8 (L) 07/07/2016 0513   PROT 6.0 (L) 07/07/2016 0513   ALBUMIN 3.1 (L) 07/07/2016 0513   AST 168 (H) 07/07/2016 0513   ALT 181 (H) 07/07/2016 0513   ALKPHOS 285 (H) 07/07/2016 0513   BILITOT 11.3 (H) 07/07/2016 0513   GFRNONAA >60 07/07/2016 0513   GFRAA >60 07/07/2016 0513   Studies/Results: MRCP:  Personally reviewed images with patient; suspect Klatskin's type tumor at biliary confluence; no obvious metastatic disease  Assessment:  1.  Obstructive jaundice, suspected Klatskin's tumor. 2.  Elevated LFTs, likely from #1 above. 3.  Pruritus, likely from #1 and #2 above.  Plan:  1.   Interventional radiology consultation for percutaneous biliary drain(s); lesion is not amenable to ERCP decompression. 2.  Surgical consultation for discussion of down-the-road surgical candidacy. 3.  Awaiting Ca 19-9 levels and CEA  levels. 4.  Cholestyramine for pruritus. 5.  NPO for now, in case PTC can be done today; if it can't be done today, patient can have regular diet today with timing of NPO order per IR team. 6.  [facility] will follow.   KORVIN, VALENTINE 07/07/2016, 10:19 AM   Pager [phone] If no answer or after 5 PM call [phone]

## 2016-07-07 NOTE — Procedures (Signed)
S/p 10 FR LT INT/EXT BILIARY DRAIN  No immed complications Stable Clear bile aspirated drain to external gravity bag.  Full report in PACS

## 2016-07-08 ENCOUNTER — Inpatient Hospital Stay (HOSPITAL_COMMUNITY): Payer: Medicare Other

## 2016-07-08 DIAGNOSIS — C221 Intrahepatic bile duct carcinoma: Secondary | ICD-10-CM

## 2016-07-08 LAB — COMPREHENSIVE METABOLIC PANEL
ALT: 192 U/L — AB (ref 17–63)
AST: 170 U/L — AB (ref 15–41)
Albumin: 3.4 g/dL — ABNORMAL LOW (ref 3.5–5.0)
Alkaline Phosphatase: 328 U/L — ABNORMAL HIGH (ref 38–126)
Anion gap: 8 (ref 5–15)
BUN: 20 mg/dL (ref 6–20)
CHLORIDE: 101 mmol/L (ref 101–111)
CO2: 25 mmol/L (ref 22–32)
CREATININE: 0.52 mg/dL — AB (ref 0.61–1.24)
Calcium: 9.1 mg/dL (ref 8.9–10.3)
Glucose, Bld: 115 mg/dL — ABNORMAL HIGH (ref 65–99)
POTASSIUM: 4.4 mmol/L (ref 3.5–5.1)
SODIUM: 134 mmol/L — AB (ref 135–145)
Total Bilirubin: 15.5 mg/dL — ABNORMAL HIGH (ref 0.3–1.2)
Total Protein: 6.4 g/dL — ABNORMAL LOW (ref 6.5–8.1)

## 2016-07-08 LAB — CBC WITH DIFFERENTIAL/PLATELET
Basophils Absolute: 0 10*3/uL (ref 0.0–0.1)
Basophils Relative: 0 %
Eosinophils Absolute: 0 10*3/uL (ref 0.0–0.7)
Eosinophils Relative: 0 %
HCT: 39.2 % (ref 39.0–52.0)
HEMOGLOBIN: 13.8 g/dL (ref 13.0–17.0)
LYMPHS ABS: 0.8 10*3/uL (ref 0.7–4.0)
LYMPHS PCT: 6 %
MCH: 29.6 pg (ref 26.0–34.0)
MCHC: 35.2 g/dL (ref 30.0–36.0)
MCV: 83.9 fL (ref 78.0–100.0)
MONOS PCT: 6 %
Monocytes Absolute: 0.8 10*3/uL (ref 0.1–1.0)
NEUTROS PCT: 88 %
Neutro Abs: 11.3 10*3/uL — ABNORMAL HIGH (ref 1.7–7.7)
Platelets: 168 10*3/uL (ref 150–400)
RBC: 4.67 MIL/uL (ref 4.22–5.81)
RDW: 15.5 % (ref 11.5–15.5)
WBC: 12.9 10*3/uL — AB (ref 4.0–10.5)

## 2016-07-08 LAB — CANCER ANTIGEN 19-9: CA 19-9: 291 U/mL — ABNORMAL HIGH (ref 0–35)

## 2016-07-08 LAB — CEA: CEA: 4.1 ng/mL (ref 0.0–4.7)

## 2016-07-08 LAB — LIPASE, BLOOD: Lipase: 3781 U/L — ABNORMAL HIGH (ref 11–51)

## 2016-07-08 MED ORDER — SODIUM CHLORIDE 0.9 % IV BOLUS (SEPSIS)
1000.0000 mL | Freq: Once | INTRAVENOUS | Status: AC
  Administered 2016-07-08: 1000 mL via INTRAVENOUS

## 2016-07-08 MED ORDER — SODIUM CHLORIDE 0.9 % IV SOLN
INTRAVENOUS | Status: DC
Start: 2016-07-08 — End: 2016-07-10
  Administered 2016-07-08 – 2016-07-10 (×4): via INTRAVENOUS

## 2016-07-08 MED ORDER — PROMETHAZINE HCL 25 MG PO TABS: 12.5000 mg | ORAL_TABLET | Freq: Four times a day (QID) | ORAL | Status: DC | PRN

## 2016-07-08 MED ORDER — PROMETHAZINE HCL 25 MG RE SUPP: 25.0000 mg | Freq: Four times a day (QID) | RECTAL | Status: DC | PRN

## 2016-07-08 MED ORDER — CHOLESTYRAMINE 4 G PO PACK
4.0000 g | PACK | Freq: Two times a day (BID) | ORAL | Status: DC | PRN
  Filled 2016-07-08: qty 1

## 2016-07-08 MED ORDER — PROMETHAZINE HCL 25 MG/ML IJ SOLN
12.5000 mg | Freq: Four times a day (QID) | INTRAMUSCULAR | Status: DC | PRN
  Administered 2016-07-08: 12.5 mg via INTRAVENOUS
  Filled 2016-07-08: qty 1

## 2016-07-08 MED ORDER — HYDROMORPHONE HCL 1 MG/ML IJ SOLN
1.0000 mg | INTRAMUSCULAR | Status: DC | PRN
  Administered 2016-07-08 – 2016-07-11 (×13): 1 mg via INTRAVENOUS
  Filled 2016-07-08 (×13): qty 1

## 2016-07-08 MED ORDER — SODIUM CHLORIDE 0.9 % IV SOLN: INTRAVENOUS | Status: DC

## 2016-07-08 MED ORDER — POLYETHYLENE GLYCOL 3350 17 G PO PACK: 17.0000 g | PACK | Freq: Every day | ORAL | Status: DC | PRN

## 2016-07-08 MED ORDER — PROMETHAZINE HCL 25 MG/ML IJ SOLN
6.2500 mg | Freq: Four times a day (QID) | INTRAMUSCULAR | Status: DC | PRN
  Administered 2016-07-08 – 2016-07-11 (×8): 6.25 mg via INTRAVENOUS
  Filled 2016-07-08 (×8): qty 1

## 2016-07-08 NOTE — Progress Notes (Signed)
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  Subjective:  Pt currently asleep; family in room; pt didn't sleep well last night secondary to abd pain, primarily RLQ; intermittent nausea, persistent jaundice  Allergies: Percocet [oxycodone-acetaminophen]  Medications: Prior to Admission medications   Not on File     Vital Signs: BP 139/67 (BP Location: Right Arm)   Pulse (!) 59   Temp 98.6 F (37 C) (Oral)   Resp 18   Ht 5\' 7"  (1.702 m)   Wt 145 lb 11.6 oz (66.1 kg)   SpO2 98%   BMI 22.82 kg/m   Physical Exam asleep but arousable; biliary drain intact, output 250 cc dark green bile; insertion site ok, drain flushed with NS without difficulty  Imaging: Ct Abdomen Pelvis W Contrast  Result Date: 07/05/2016 CLINICAL DATA:  Subacute onset of right lower quadrant abdominal pain and nausea. Initial encounter. EXAM: CT ABDOMEN AND PELVIS WITH CONTRAST TECHNIQUE: Multidetector CT imaging of the abdomen and pelvis was performed using the standard protocol following bolus administration of intravenous contrast. CONTRAST:  177mL ISOVUE-300 IOPAMIDOL (ISOVUE-300) INJECTION 61% COMPARISON:  CT of the abdomen and pelvis from 05/07/2011 FINDINGS: Lower chest: The visualized lung bases are grossly clear. The visualized portions of the mediastinum are unremarkable. Hepatobiliary: There is marked dilatation of the intrahepatic biliary ducts, particularly at the left hepatic lobe, though also to some extent within the inferior right hepatic lobe. On evaluation of coronal images, this is thought to reflect a mass at the proximal common bile duct along the edge of the liver, measuring approximately 2.4 x 1.7 x 1.7 cm, concerning for a cholangiocarcinoma. The common bile duct remains normal in caliber status post cholecystectomy. Clips are noted at the  gallbladder fossa. Pancreas: The pancreas is within normal limits. Spleen: The spleen is unremarkable in appearance. Adrenals/Urinary Tract: The adrenal glands are unremarkable in appearance. Scattered bilateral renal cysts are noted, larger on the left. Nonspecific perinephric stranding is noted bilaterally. There is no evidence of hydronephrosis. No renal or ureteral stones are identified. Stomach/Bowel: The stomach is unremarkable in appearance. The small bowel is within normal limits. The patient is status post appendectomy. The colon is unremarkable in appearance. Vascular/Lymphatic: Scattered calcification is seen along the abdominal aorta and its branches. There is slight ectasia of the infrarenal abdominal aorta, without evidence of aneurysmal dilatation. The inferior vena cava is grossly unremarkable. No retroperitoneal lymphadenopathy is seen. No pelvic sidewall lymphadenopathy is identified. Reproductive: The prostate is mildly enlarged, measuring 5.1 cm in transverse dimension. The bladder is mildly distended and grossly unremarkable. Other: No additional soft tissue abnormalities are seen. Musculoskeletal: No acute osseous abnormalities are identified. Disc space narrowing is noted along the lower lumbar spine. The visualized musculature is unremarkable in appearance. IMPRESSION: 1. Marked dilatation of the intrahepatic biliary ducts, particularly at the left hepatic lobe, though also to some extent within the inferior right hepatic lobe. On evaluation of coronal images, this is thought to reflect a mass at the proximal common bile duct along the edge of the liver, measuring approximately 2.4 x 1.7 x 1.7 cm, concerning for cholangiocarcinoma. ERCP or MRCP is recommended for further evaluation. 2. Scattered bilateral renal cysts noted. 3. Scattered aortic atherosclerosis. 4. Mildly enlarged prostate. Electronically Signed   By: Garald Balding M.D.   On: 07/05/2016 23:56   Mr 3d Recon At  Scanner  Result Date: 07/07/2016 CLINICAL  DATA:  75 year old male inpatient with obstructive jaundice, marked intrahepatic biliary ductal dilatation and suggestion of a central biliary mass on CT study from 1 day prior. Total bilirubin 11.3. EXAM: MRI ABDOMEN WITHOUT AND WITH CONTRAST (INCLUDING MRCP) TECHNIQUE: Multiplanar multisequence MR imaging of the abdomen was performed both before and after the administration of intravenous contrast. Heavily T2-weighted images of the biliary and pancreatic ducts were obtained, and three-dimensional MRCP images were rendered by post processing. CONTRAST:  71mL MULTIHANCE GADOBENATE DIMEGLUMINE 529 MG/ML IV SOLN COMPARISON:  07/05/2016 CT abdomen/ pelvis. FINDINGS: Lower chest: Mild scarring versus atelectasis in the dependent right lung base. Hepatobiliary: There is relative atrophy of the left liver lobe. Normal size right liver lobe. Minimal diffuse hepatic steatosis. There are 2 subcentimeter simple liver cysts in the anterior liver dome and anterior inferior right liver lobe. There is marked diffuse intrahepatic biliary ductal dilatation, more prominent in the left than right liver lobes. There is a 1.7 x 1.5 x 1.2 cm mass centered at the common bile duct bifurcation (series 1404/ image 47), which demonstrates delayed hyperenhancement. Cholecystectomy. Common bile duct is normal caliber (5 mm diameter) below the central biliary mass. Pancreas: No pancreas divisum. Mildly dilated ventral pancreatic duct up to 4 mm diameter in the pancreatic head. Main pancreatic duct is normal caliber (2 mm diameter). No pancreatic mass. Spleen: Normal size. No mass. Adrenals/Urinary Tract: Normal adrenals. No hydronephrosis. There is an exophytic 3.1 x 2.8 cm renal cortical lesion in the lateral interpolar left kidney (series 4/ image 36), which demonstrates heterogeneous precontrast T1 hyperintensity and no convincing enhancement, consistent with a Bosniak category 2  hemorrhagic/proteinaceous renal cyst. There are 2 additional smaller complex renal cysts in the anterior upper left kidney demonstrating heterogeneous precontrast T1 hyperintensity and no appreciable enhancement, also compatible with Bosniak category 2 hemorrhagic/ proteinaceous renal cysts. Numerous simple renal cysts are present in both kidneys, largest 2.7 cm in the anterior upper left kidney. Stomach/Bowel: Grossly normal stomach. Visualized small and large bowel is normal caliber, with no bowel wall thickening. Vascular/Lymphatic: Atherosclerotic nonaneurysmal abdominal aorta. Patent main portal, right portal, splenic, hepatic and renal veins. There is narrowing of the left portal vein as it courses adjacent to the central biliary mass. No pathologically enlarged lymph nodes in the abdomen. Other: No abdominal ascites or focal fluid collection. Musculoskeletal: No aggressive appearing focal osseous lesions. Small T2 hyperintense bone lesion in the left T11 vertebral body is stable since 2007 CT study, consistent with a benign lesion, probably a hemangioma. IMPRESSION: 1. Marked diffuse intrahepatic biliary ductal dilatation, most prominent in the left liver lobe, which demonstrates relative atrophy. Mass at the common bile duct bifurcation measures 1.7 x 1.5 x 1.2 cm and demonstrates delayed hyperenhancement, consistent with a hilar cholangiocarcinoma (Klatskin tumor). 2. Narrowing of the left portal vein as it courses adjacent to the central biliary mass. 3. No evidence of metastatic disease in the abdomen. 4. Additional findings include aortic atherosclerosis, minimal diffuse hepatic steatosis and Bosniak category 1 and category 2 renal cysts. Electronically Signed   By: Ilona Sorrel M.D.   On: 07/07/2016 09:10   Dg Abd Portable 1v  Result Date: 07/08/2016 CLINICAL DATA:  Right lower quadrant abdominal pain, nausea, vomiting. EXAM: PORTABLE ABDOMEN - 1 VIEW COMPARISON:  Radiographs of July 07, 2016.  FINDINGS: The bowel gas pattern is normal. Biliary drainage catheter is again noted and unchanged in position. No abnormal calcifications are noted. IMPRESSION: No evidence of bowel obstruction or ileus. Biliary drainage catheter  in grossly good position. Electronically Signed   By: Marijo Conception, M.D.   On: 07/08/2016 08:51   Mr Jeananne Rama W/wo Cm/mrcp  Result Date: 07/07/2016 CLINICAL DATA:  75 year old male inpatient with obstructive jaundice, marked intrahepatic biliary ductal dilatation and suggestion of a central biliary mass on CT study from 1 day prior. Total bilirubin 11.3. EXAM: MRI ABDOMEN WITHOUT AND WITH CONTRAST (INCLUDING MRCP) TECHNIQUE: Multiplanar multisequence MR imaging of the abdomen was performed both before and after the administration of intravenous contrast. Heavily T2-weighted images of the biliary and pancreatic ducts were obtained, and three-dimensional MRCP images were rendered by post processing. CONTRAST:  85mL MULTIHANCE GADOBENATE DIMEGLUMINE 529 MG/ML IV SOLN COMPARISON:  07/05/2016 CT abdomen/ pelvis. FINDINGS: Lower chest: Mild scarring versus atelectasis in the dependent right lung base. Hepatobiliary: There is relative atrophy of the left liver lobe. Normal size right liver lobe. Minimal diffuse hepatic steatosis. There are 2 subcentimeter simple liver cysts in the anterior liver dome and anterior inferior right liver lobe. There is marked diffuse intrahepatic biliary ductal dilatation, more prominent in the left than right liver lobes. There is a 1.7 x 1.5 x 1.2 cm mass centered at the common bile duct bifurcation (series 1404/ image 47), which demonstrates delayed hyperenhancement. Cholecystectomy. Common bile duct is normal caliber (5 mm diameter) below the central biliary mass. Pancreas: No pancreas divisum. Mildly dilated ventral pancreatic duct up to 4 mm diameter in the pancreatic head. Main pancreatic duct is normal caliber (2 mm diameter). No pancreatic mass. Spleen:  Normal size. No mass. Adrenals/Urinary Tract: Normal adrenals. No hydronephrosis. There is an exophytic 3.1 x 2.8 cm renal cortical lesion in the lateral interpolar left kidney (series 4/ image 36), which demonstrates heterogeneous precontrast T1 hyperintensity and no convincing enhancement, consistent with a Bosniak category 2 hemorrhagic/proteinaceous renal cyst. There are 2 additional smaller complex renal cysts in the anterior upper left kidney demonstrating heterogeneous precontrast T1 hyperintensity and no appreciable enhancement, also compatible with Bosniak category 2 hemorrhagic/ proteinaceous renal cysts. Numerous simple renal cysts are present in both kidneys, largest 2.7 cm in the anterior upper left kidney. Stomach/Bowel: Grossly normal stomach. Visualized small and large bowel is normal caliber, with no bowel wall thickening. Vascular/Lymphatic: Atherosclerotic nonaneurysmal abdominal aorta. Patent main portal, right portal, splenic, hepatic and renal veins. There is narrowing of the left portal vein as it courses adjacent to the central biliary mass. No pathologically enlarged lymph nodes in the abdomen. Other: No abdominal ascites or focal fluid collection. Musculoskeletal: No aggressive appearing focal osseous lesions. Small T2 hyperintense bone lesion in the left T11 vertebral body is stable since 2007 CT study, consistent with a benign lesion, probably a hemangioma. IMPRESSION: 1. Marked diffuse intrahepatic biliary ductal dilatation, most prominent in the left liver lobe, which demonstrates relative atrophy. Mass at the common bile duct bifurcation measures 1.7 x 1.5 x 1.2 cm and demonstrates delayed hyperenhancement, consistent with a hilar cholangiocarcinoma (Klatskin tumor). 2. Narrowing of the left portal vein as it courses adjacent to the central biliary mass. 3. No evidence of metastatic disease in the abdomen. 4. Additional findings include aortic atherosclerosis, minimal diffuse hepatic  steatosis and Bosniak category 1 and category 2 renal cysts. Electronically Signed   By: Ilona Sorrel M.D.   On: 07/07/2016 09:10   Ir Int Lianne Cure Biliary Drain With Cholangiogram  Result Date: 07/07/2016 INDICATION: Obstructive jaundice, concern for Klatskin tumor by CT and MR. EXAM: IR INT-EXT BILIARY DRAIN W/ CHOLANGIOGRAM MEDICATIONS: 3.375 g Zosyn; The  antibiotic was administered within an appropriate time frame prior to the initiation of the procedure. ANESTHESIA/SEDATION: Moderate (conscious) sedation was employed during this procedure. A total of Versed 3.0 mg and Fentanyl 100 mcg was administered intravenously. Moderate Sedation Time: 23 minutes. The patient's level of consciousness and vital signs were monitored continuously by radiology nursing throughout the procedure under my direct supervision. FLUOROSCOPY TIME:  Fluoroscopy Time: 6 minutes 12 seconds (72 mGy). COMPLICATIONS: None immediate. PROCEDURE: Informed written consent was obtained from the patient after a thorough discussion of the procedural risks, benefits and alternatives. All questions were addressed. Maximal Sterile Barrier Technique was utilized including caps, mask, sterile gowns, sterile gloves, sterile drape, hand hygiene and skin antiseptic. A timeout was performed prior to the initiation of the procedure. Previous imaging reviewed. Patient positioned supine. Preliminary ultrasound performed of the liver from a subxiphoid approach. Dilated left hepatic ducts localized. Under sterile conditions and local anesthesia, ultrasound percutaneous needle access performed of a dilated left hepatic duct from a subxiphoid approach. Contrast injection confirms position within the duct and diffuse left biliary dilatation correlating with the prior imaging. 018 guidewire advanced followed by the Accustick dilator set. This allowed guidewire exchange to insert a 5 [place] Kumpe catheter. Catheter and Glidewire were advanced through the biliary  confluence obstruction into the common bile duct. Contrast injection confirmed position in the common bile duct. Catheter and guidewire were advanced into the duodenum. Over the Amplatz guidewire, tract dilatation performed to advance a 10 [place] internal external biliary drain. Injection of the biliary drain confirms contrast filling both the right and left hepatic ducts at the biliary confluence obstruction. Therefore, initially only a single drain was placed. Catheter was secured with a Prolene suture. Biliary system was decompressed by syringe aspiration. Gravity drainage bag connected followed by sterile dressing. Patient tolerated the procedure well. No immediate complication. IMPRESSION: Successful ultrasound and fluoroscopic 10 French left internal external biliary drain as above. Electronically Signed   By: Jerilynn Mages.  Shick M.D.   On: 07/07/2016 17:06    Labs:  CBC:  Recent Labs  07/05/16 1952 07/06/16 0530 07/08/16 0836  WBC 6.5 5.6 12.9*  HGB 12.9* 12.4* 13.8  HCT 37.4* 36.3* 39.2  PLT 185 169 168    COAGS:  Recent Labs  07/07/16 0513  INR 0.91    BMP:  Recent Labs  07/05/16 1952 07/06/16 0530 07/07/16 0513 07/08/16 0836  NA 138 139 137 134*  K 3.9 3.8 3.9 4.4  CL 103 105 105 101  CO2 29 27 26 25   GLUCOSE 92 96 109* 115*  BUN 13 13 15 20   CALCIUM 9.4 9.2 8.8* 9.1  CREATININE 0.74 0.60* 0.60* 0.52*  GFRNONAA >60 >60 >60 >60  GFRAA >60 >60 >60 >60    LIVER FUNCTION TESTS:  Recent Labs  07/05/16 1952 07/06/16 0530 07/07/16 0513 07/08/16 0836  BILITOT 9.2* 10.0* 11.3* 15.5*  AST 153* 147* 168* 170*  ALT 195* 182* 181* 192*  ALKPHOS 313* 296* 285* 328*  PROT 7.1 6.4* 6.0* 6.4*  ALBUMIN 3.7 3.3* 3.1* 3.4*    Assessment and Plan: 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 Rowe Robert 07/08/2016, 11:37 AM   I  spent a total of 15 minutes at the the patient's bedside AND on the patient's hospital floor or unit, greater than 50% of which was counseling/coordinating care for biliary drain    Patient ID: Jeremiah Warren, male   DOB: [birth year], 75 y.o.   MRN: [id number]

## 2016-07-08 NOTE — Progress Notes (Signed)
Patient ID: Jeremiah PLATTEN, male   DOB: [birth year], 75 y.o.   MRN: [id number]  [facility] Progress Note     Subjective: Patient asleep during our visit. Daughter at bedside states that he did not sleep at all last night. Had biliary drain placed yesterday. Daughter states that he has been complaining of RLQ abdominal pain; XR showed no obstruction.  Objective: Vital signs in last 24 hours: Temp:  [97.6 F (36.4 C)-98.6 F (37 C)] 98.6 F (37 C) (10/13 0527) Pulse Rate:  [52-62] 59 (10/13 0527) Resp:  [10-18] 18 (10/13 0527) BP: (139-179)/(63-87) 139/67 (10/13 0527) SpO2:  [97 %-100 %] 98 % (10/13 0527) Last BM Date: 07/06/16  Intake/Output from previous day: 10/12 0701 - 10/13 0700 In: 200 [P.O.:200] Out: 650 [Urine:400; Drains:250] Intake/Output this shift: No intake/output data recorded.   Lab Results:   Recent Labs  07/06/16 0530 07/08/16 0836  WBC 5.6 12.9*  HGB 12.4* 13.8  HCT 36.3* 39.2  PLT 169 168   BMET  Recent Labs  07/07/16 0513 07/08/16 0836  NA 137 134*  K 3.9 4.4  CL 105 101  CO2 26 25  GLUCOSE 109* 115*  BUN 15 20  CREATININE 0.60* 0.52*  CALCIUM 8.8* 9.1   PT/INR  Recent Labs  07/07/16 0513  LABPROT 12.3  INR 0.91   CMP     Component Value Date/Time   NA 134 (L) 07/08/2016 0836   K 4.4 07/08/2016 0836   CL 101 07/08/2016 0836   CO2 25 07/08/2016 0836   GLUCOSE 115 (H) 07/08/2016 0836   BUN 20 07/08/2016 0836   CREATININE 0.52 (L) 07/08/2016 0836   CALCIUM 9.1 07/08/2016 0836   PROT 6.4 (L) 07/08/2016 0836   ALBUMIN 3.4 (L) 07/08/2016 0836   AST 170 (H) 07/08/2016 0836   ALT 192 (H) 07/08/2016 0836   ALKPHOS 328 (H) 07/08/2016 0836   BILITOT 15.5 (H) 07/08/2016 0836   GFRNONAA >60 07/08/2016 0836   GFRAA >60 07/08/2016 0836   Lipase     Component Value Date/Time   LIPASE 52 (H) 07/05/2016 1952       Studies/Results: Mr 3d Recon At Scanner  Result Date: 07/07/2016 CLINICAL DATA:  75 year old  male inpatient with obstructive jaundice, marked intrahepatic biliary ductal dilatation and suggestion of a central biliary mass on CT study from 1 day prior. Total bilirubin 11.3. EXAM: MRI ABDOMEN WITHOUT AND WITH CONTRAST (INCLUDING MRCP) TECHNIQUE: Multiplanar multisequence MR imaging of the abdomen was performed both before and after the administration of intravenous contrast. Heavily T2-weighted images of the biliary and pancreatic ducts were obtained, and three-dimensional MRCP images were rendered by post processing. CONTRAST:  30m MULTIHANCE GADOBENATE DIMEGLUMINE 529 MG/ML IV SOLN COMPARISON:  07/05/2016 CT abdomen/ pelvis. FINDINGS: Lower chest: Mild scarring versus atelectasis in the dependent right lung base. Hepatobiliary: There is relative atrophy of the left liver lobe. Normal size right liver lobe. Minimal diffuse hepatic steatosis. There are 2 subcentimeter simple liver cysts in the anterior liver dome and anterior inferior right liver lobe. There is marked diffuse intrahepatic biliary ductal dilatation, more prominent in the left than right liver lobes. There is a 1.7 x 1.5 x 1.2 cm mass centered at the common bile duct bifurcation (series 1404/ image 47), which demonstrates delayed hyperenhancement. Cholecystectomy. Common bile duct is normal caliber (5 mm diameter) below the central biliary mass. Pancreas: No pancreas divisum. Mildly dilated ventral pancreatic duct up to 4 mm diameter in the pancreatic head. Main  pancreatic duct is normal caliber (2 mm diameter). No pancreatic mass. Spleen: Normal size. No mass. Adrenals/Urinary Tract: Normal adrenals. No hydronephrosis. There is an exophytic 3.1 x 2.8 cm renal cortical lesion in the lateral interpolar left kidney (series 4/ image 36), which demonstrates heterogeneous precontrast T1 hyperintensity and no convincing enhancement, consistent with a Bosniak category 2 hemorrhagic/proteinaceous renal cyst. There are 2 additional smaller complex  renal cysts in the anterior upper left kidney demonstrating heterogeneous precontrast T1 hyperintensity and no appreciable enhancement, also compatible with Bosniak category 2 hemorrhagic/ proteinaceous renal cysts. Numerous simple renal cysts are present in both kidneys, largest 2.7 cm in the anterior upper left kidney. Stomach/Bowel: Grossly normal stomach. Visualized small and large bowel is normal caliber, with no bowel wall thickening. Vascular/Lymphatic: Atherosclerotic nonaneurysmal abdominal aorta. Patent main portal, right portal, splenic, hepatic and renal veins. There is narrowing of the left portal vein as it courses adjacent to the central biliary mass. No pathologically enlarged lymph nodes in the abdomen. Other: No abdominal ascites or focal fluid collection. Musculoskeletal: No aggressive appearing focal osseous lesions. Small T2 hyperintense bone lesion in the left T11 vertebral body is stable since 2007 CT study, consistent with a benign lesion, probably a hemangioma. IMPRESSION: 1. Marked diffuse intrahepatic biliary ductal dilatation, most prominent in the left liver lobe, which demonstrates relative atrophy. Mass at the common bile duct bifurcation measures 1.7 x 1.5 x 1.2 cm and demonstrates delayed hyperenhancement, consistent with a hilar cholangiocarcinoma (Klatskin tumor). 2. Narrowing of the left portal vein as it courses adjacent to the central biliary mass. 3. No evidence of metastatic disease in the abdomen. 4. Additional findings include aortic atherosclerosis, minimal diffuse hepatic steatosis and Bosniak category 1 and category 2 renal cysts. Electronically Signed   By: Ilona Sorrel M.D.   On: 07/07/2016 09:10   Dg Abd Portable 1v  Result Date: 07/08/2016 CLINICAL DATA:  Right lower quadrant abdominal pain, nausea, vomiting. EXAM: PORTABLE ABDOMEN - 1 VIEW COMPARISON:  Radiographs of July 07, 2016. FINDINGS: The bowel gas pattern is normal. Biliary drainage catheter is again  noted and unchanged in position. No abnormal calcifications are noted. IMPRESSION: No evidence of bowel obstruction or ileus. Biliary drainage catheter in grossly good position. Electronically Signed   By: Marijo Conception, M.D.   On: 07/08/2016 08:51   Mr Jeananne Rama W/wo Cm/mrcp  Result Date: 07/07/2016 CLINICAL DATA:  75 year old male inpatient with obstructive jaundice, marked intrahepatic biliary ductal dilatation and suggestion of a central biliary mass on CT study from 1 day prior. Total bilirubin 11.3. EXAM: MRI ABDOMEN WITHOUT AND WITH CONTRAST (INCLUDING MRCP) TECHNIQUE: Multiplanar multisequence MR imaging of the abdomen was performed both before and after the administration of intravenous contrast. Heavily T2-weighted images of the biliary and pancreatic ducts were obtained, and three-dimensional MRCP images were rendered by post processing. CONTRAST:  6m MULTIHANCE GADOBENATE DIMEGLUMINE 529 MG/ML IV SOLN COMPARISON:  07/05/2016 CT abdomen/ pelvis. FINDINGS: Lower chest: Mild scarring versus atelectasis in the dependent right lung base. Hepatobiliary: There is relative atrophy of the left liver lobe. Normal size right liver lobe. Minimal diffuse hepatic steatosis. There are 2 subcentimeter simple liver cysts in the anterior liver dome and anterior inferior right liver lobe. There is marked diffuse intrahepatic biliary ductal dilatation, more prominent in the left than right liver lobes. There is a 1.7 x 1.5 x 1.2 cm mass centered at the common bile duct bifurcation (series 1404/ image 47), which demonstrates delayed hyperenhancement. Cholecystectomy. Common bile duct  is normal caliber (5 mm diameter) below the central biliary mass. Pancreas: No pancreas divisum. Mildly dilated ventral pancreatic duct up to 4 mm diameter in the pancreatic head. Main pancreatic duct is normal caliber (2 mm diameter). No pancreatic mass. Spleen: Normal size. No mass. Adrenals/Urinary Tract: Normal adrenals. No  hydronephrosis. There is an exophytic 3.1 x 2.8 cm renal cortical lesion in the lateral interpolar left kidney (series 4/ image 36), which demonstrates heterogeneous precontrast T1 hyperintensity and no convincing enhancement, consistent with a Bosniak category 2 hemorrhagic/proteinaceous renal cyst. There are 2 additional smaller complex renal cysts in the anterior upper left kidney demonstrating heterogeneous precontrast T1 hyperintensity and no appreciable enhancement, also compatible with Bosniak category 2 hemorrhagic/ proteinaceous renal cysts. Numerous simple renal cysts are present in both kidneys, largest 2.7 cm in the anterior upper left kidney. Stomach/Bowel: Grossly normal stomach. Visualized small and large bowel is normal caliber, with no bowel wall thickening. Vascular/Lymphatic: Atherosclerotic nonaneurysmal abdominal aorta. Patent main portal, right portal, splenic, hepatic and renal veins. There is narrowing of the left portal vein as it courses adjacent to the central biliary mass. No pathologically enlarged lymph nodes in the abdomen. Other: No abdominal ascites or focal fluid collection. Musculoskeletal: No aggressive appearing focal osseous lesions. Small T2 hyperintense bone lesion in the left T11 vertebral body is stable since 2007 CT study, consistent with a benign lesion, probably a hemangioma. IMPRESSION: 1. Marked diffuse intrahepatic biliary ductal dilatation, most prominent in the left liver lobe, which demonstrates relative atrophy. Mass at the common bile duct bifurcation measures 1.7 x 1.5 x 1.2 cm and demonstrates delayed hyperenhancement, consistent with a hilar cholangiocarcinoma (Klatskin tumor). 2. Narrowing of the left portal vein as it courses adjacent to the central biliary mass. 3. No evidence of metastatic disease in the abdomen. 4. Additional findings include aortic atherosclerosis, minimal diffuse hepatic steatosis and Bosniak category 1 and category 2 renal cysts.  Electronically Signed   By: Delbert Phenix M.D.   On: 07/07/2016 09:10   Ir Int Ezzard Standing Biliary Drain With Cholangiogram  Result Date: 07/07/2016 INDICATION: Obstructive jaundice, concern for Klatskin tumor by CT and MR. EXAM: IR INT-EXT BILIARY DRAIN W/ CHOLANGIOGRAM MEDICATIONS: 3.375 g Zosyn; The antibiotic was administered within an appropriate time frame prior to the initiation of the procedure. ANESTHESIA/SEDATION: Moderate (conscious) sedation was employed during this procedure. A total of Versed 3.0 mg and Fentanyl 100 mcg was administered intravenously. Moderate Sedation Time: 23 minutes. The patient's level of consciousness and vital signs were monitored continuously by radiology nursing throughout the procedure under my direct supervision. FLUOROSCOPY TIME:  Fluoroscopy Time: 6 minutes 12 seconds (72 mGy). COMPLICATIONS: None immediate. PROCEDURE: Informed written consent was obtained from the patient after a thorough discussion of the procedural risks, benefits and alternatives. All questions were addressed. Maximal Sterile Barrier Technique was utilized including caps, mask, sterile gowns, sterile gloves, sterile drape, hand hygiene and skin antiseptic. A timeout was performed prior to the initiation of the procedure. Previous imaging reviewed. Patient positioned supine. Preliminary ultrasound performed of the liver from a subxiphoid approach. Dilated left hepatic ducts localized. Under sterile conditions and local anesthesia, ultrasound percutaneous needle access performed of a dilated left hepatic duct from a subxiphoid approach. Contrast injection confirms position within the duct and diffuse left biliary dilatation correlating with the prior imaging. 018 guidewire advanced followed by the Accustick dilator set. This allowed guidewire exchange to insert a 5 [place] Kumpe catheter. Catheter and Glidewire were advanced through the biliary confluence  obstruction into the common bile duct. Contrast  injection confirmed position in the common bile duct. Catheter and guidewire were advanced into the duodenum. Over the Amplatz guidewire, tract dilatation performed to advance a 10 [place] internal external biliary drain. Injection of the biliary drain confirms contrast filling both the right and left hepatic ducts at the biliary confluence obstruction. Therefore, initially only a single drain was placed. Catheter was secured with a Prolene suture. Biliary system was decompressed by syringe aspiration. Gravity drainage bag connected followed by sterile dressing. Patient tolerated the procedure well. No immediate complication. IMPRESSION: Successful ultrasound and fluoroscopic 10 French left internal external biliary drain as above. Electronically Signed   By: Jerilynn Mages.  Shick M.D.   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 Prior abdominal surgical history includes cholecystectomy and appendectomy. - 3 weeks of progressive worsening orange urine, grey stools, yellow skin, weakness, and right sided abdominal achiness - CT scan showed dilated intrahepatic ducts, mass near biliary bifurcation, normal extrahepatic bile ducts - MRCP showed a mass at the common bile duct bifurcation measures 1.7 x 1.5 x 1.2 cm - lipase 52, bilirubin 15.5, AST/ALT 170/192, alk phos 328 - Ca19-9 elevated at 291, CEA 4.1 - S/p percutaneous biliary drain yesterday   Plan - Discussed case with Dr. Barry Dienes who recommends referring patient to Dr. Dewaine Conger at [facility] for surgical evaluation. Patient's daughter trying to consider all options, currently researching other facilities.   LOS: 2 days    Jerrye Beavers  , [facility] 07/08/2016, 9:43 AM Pager: [phone] Consults: [phone] Mon-Fri 7:00 am-4:30 pm Sat-Sun 7:00 am-11:30 am

## 2016-07-08 NOTE — Progress Notes (Signed)
07/08/2016 3:13 PM  I called and spoke with IR MD about elevated lipase.  It seems patient has developed pancreatitis following his drain placement.  He says to treat with supportive therapy, repeat labs in morning.  There was nothing that needed to be done with drain as it is draining and functioning well.  Will update patient at bedside.  I ordered IVFs.  Monitor I/Os.    Murvin Natal, MD

## 2016-07-08 NOTE — Progress Notes (Signed)
Subjective: Having some generalized upper and periumbilical pain, worse after procedure (but was there beforehand).  Objective: Vital signs in last 24 hours: Temp:  [97.6 F (36.4 C)-98.6 F (37 C)] 98.6 F (37 C) (10/13 0527) Pulse Rate:  [52-62] 59 (10/13 0527) Resp:  [10-18] 18 (10/13 0527) BP: (139-179)/(63-87) 139/67 (10/13 0527) SpO2:  [97 %-100 %] 98 % (10/13 0527) Weight change:  Last BM Date: 07/07/16 (very small amount after enema)  PE: GEN:  Appears somewhat uncomfortable. SKIN:  Jaundiced ABD:  Moderate tenderness epigastric and periumbilical, present bowel sounds, PTC drain in place  Lab Results: CBC    Component Value Date/Time   WBC 12.9 (H) 07/08/2016 0836   RBC 4.67 07/08/2016 0836   HGB 13.8 07/08/2016 0836   HCT 39.2 07/08/2016 0836   PLT 168 07/08/2016 0836   MCV 83.9 07/08/2016 0836   MCH 29.6 07/08/2016 0836   MCHC 35.2 07/08/2016 0836   RDW 15.5 07/08/2016 0836   LYMPHSABS 0.8 07/08/2016 0836   MONOABS 0.8 07/08/2016 0836   EOSABS 0.0 07/08/2016 0836   BASOSABS 0.0 07/08/2016 0836   CMP     Component Value Date/Time   NA 134 (L) 07/08/2016 0836   K 4.4 07/08/2016 0836   CL 101 07/08/2016 0836   CO2 25 07/08/2016 0836   GLUCOSE 115 (H) 07/08/2016 0836   BUN 20 07/08/2016 0836   CREATININE 0.52 (L) 07/08/2016 0836   CALCIUM 9.1 07/08/2016 0836   PROT 6.4 (L) 07/08/2016 0836   ALBUMIN 3.4 (L) 07/08/2016 0836   AST 170 (H) 07/08/2016 0836   ALT 192 (H) 07/08/2016 0836   ALKPHOS 328 (H) 07/08/2016 0836   BILITOT 15.5 (H) 07/08/2016 0836   GFRNONAA >60 07/08/2016 0836   GFRAA >60 07/08/2016 0836   ABD XRAY:  Personally reviewed, no acute abnormalities, PTC drain seemingly in position  CEA and Ca 19-9 elevated.  Assessment:  1.  Klatskin's type tumor with obstructive jaundice.  PTC placed to left biliary system. 2.  Elevated LFTs, interval increase. 3.  Abdominal pain, worse post-procedure.  No peritonitis on exam. 4.  Leukocytosis,  development since procedure.  Plan:  1.  Clear liquid diet only. 2.  Analgesics as needed. 3.  Check lipase. 4.  If pain persists despite conservative measures over the next 12-24 hours, would consider repeat CT scan abdomen/pelvis with contrast as next step in management. 5.  Patient ultimately, once biliary decompression has been achieved, will need surgical consultation for evaluation of resectability.  Patient would like to see the biliary surgeons at [facility].  This can be arranged on expedited fashion as outpatient. 6.  [facility] will follow.    DIMITRIOS, BALESTRIERI 07/08/2016, 12:40 PM   Pager [phone] If no answer or after 5 PM call [phone]

## 2016-07-08 NOTE — Progress Notes (Signed)
Patient complained of constipation. Miralax given earlier with no result. Fleet enema and soap sud enema done per MD, still with no result.

## 2016-07-08 NOTE — Progress Notes (Signed)
PROGRESS NOTE    Jeremiah Warren  [id number]  DOB: 12/21/1940  DOA: 07/05/2016 PCP: Jeremiah Doyne, MD Outpatient Specialists:   Hospital course: Jeremiah Warren is a 75 y.o. male healthy comes in with 3 weeks of progressive worsening orange urine along with yellow skin and right sided abdominal dull achiness.  He does not associate the pain with eating or not.  No vomiting.  No nausea.  No weight loss.  CT shows ductal dilation with concern for mass, and his bili is over 11.  Pt referred for admission for biliary obstruction likely due to a mass.   Assessment & Plan:   1. 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  GI consulted IR for percutaneous biliary drain placement done on 10/12.  See GI note for recommendations.  Clears diet only per GI.   2. Elevated LFTs and Hyperbilirubinemia - secondary to obstruction and biliary mass.   3. Nonspecific abdominal pain and nausea - symptoms improving with treatment per patient. If persists, repeat CT abd with contrast.     DVT prophylaxis: SCDs, holding anticoagulants  Code Status: full Family Communication: wife bedside Disposition Plan: TBD   Consultants:  [facility]  Procedures: MRCP  Antimicrobials:  n/a   Subjective: Pt without complaints.   Objective: Vitals:   07/07/16 1810 07/07/16 1842 07/07/16 2111 07/08/16 0527  BP: (!) 148/68 (!) 147/75 (!) 142/63 139/67  Pulse: (!) 58 (!) 55 (!) 55 (!) 59  Resp:   18 18  Temp:   98.3 F (36.8 C) 98.6 F (37 C)  TempSrc:   Oral Oral  SpO2: 100% 100% 97% 98%  Weight:      Height:        Intake/Output Summary (Last 24 hours) at 07/08/16 0820 Last data filed at 07/08/16 0500  Gross per 24 hour  Intake              200 ml  Output              650 ml  Net             -450 ml   Filed Weights   07/05/16 1921 07/06/16 0227  Weight: 60 kg (132 lb 4 oz) 66.1 kg (145 lb 11.6 oz)    Exam:  General exam: well developed, well nourished, no  distress, cooperative, icteric sclera bilateral Respiratory system: Clear. No increased work of breathing. Cardiovascular system: S1 & S2 heard, RRR. No JVD, murmurs, gallops, clicks or pedal edema. Gastrointestinal system: Abdomen is nondistended, soft and nontender. Normal bowel sounds heard. Central nervous system: Alert and oriented. No focal neurological deficits. Extremities: no CCE.  Data Reviewed: Basic Metabolic Panel:  Recent Labs Lab 07/05/16 1952 07/06/16 0530 07/07/16 0513  NA 138 139 137  K 3.9 3.8 3.9  CL 103 105 105  CO2 29 27 26   GLUCOSE 92 96 109*  BUN 13 13 15   CREATININE 0.74 0.60* 0.60*  CALCIUM 9.4 9.2 8.8*   Liver Function Tests:  Recent Labs Lab 07/05/16 1952 07/06/16 0530 07/07/16 0513  AST 153* 147* 168*  ALT 195* 182* 181*  ALKPHOS 313* 296* 285*  BILITOT 9.2* 10.0* 11.3*  PROT 7.1 6.4* 6.0*  ALBUMIN 3.7 3.3* 3.1*    Recent Labs Lab 07/05/16 1952  LIPASE 52*   No results for input(s): AMMONIA in the last 168 hours. CBC:  Recent Labs Lab 07/05/16 1952 07/06/16 0530  WBC 6.5 5.6  HGB 12.9* 12.4*  HCT 37.4* 36.3*  MCV 86.2 85.4  PLT 185 169   Cardiac Enzymes: No results for input(s): CKTOTAL, CKMB, CKMBINDEX, TROPONINI in the last 168 hours. CBG (last 3)  No results for input(s): GLUCAP in the last 72 hours. Recent Results (from the past 240 hour(s))  Surgical PCR screen     Status: None   Collection Time: 07/06/16  2:48 AM  Result Value Ref Range Status   MRSA, PCR NEGATIVE NEGATIVE Final   Staphylococcus aureus NEGATIVE NEGATIVE Final    Comment:        The Xpert SA Assay (FDA approved for NASAL specimens in patients over 60 years of age), is one component of a comprehensive surveillance program.  Test performance has been validated by [facility] for patients greater than or equal to 10 year old. It is not intended to diagnose infection nor to guide or monitor treatment.      Studies: Mr 3d Recon At  Scanner  Result Date: 07/07/2016 CLINICAL DATA:  75 year old male inpatient with obstructive jaundice, marked intrahepatic biliary ductal dilatation and suggestion of a central biliary mass on CT study from 1 day prior. Total bilirubin 11.3. EXAM: MRI ABDOMEN WITHOUT AND WITH CONTRAST (INCLUDING MRCP) TECHNIQUE: Multiplanar multisequence MR imaging of the abdomen was performed both before and after the administration of intravenous contrast. Heavily T2-weighted images of the biliary and pancreatic ducts were obtained, and three-dimensional MRCP images were rendered by post processing. CONTRAST:  65mL MULTIHANCE GADOBENATE DIMEGLUMINE 529 MG/ML IV SOLN COMPARISON:  07/05/2016 CT abdomen/ pelvis. FINDINGS: Lower chest: Mild scarring versus atelectasis in the dependent right lung base. Hepatobiliary: There is relative atrophy of the left liver lobe. Normal size right liver lobe. Minimal diffuse hepatic steatosis. There are 2 subcentimeter simple liver cysts in the anterior liver dome and anterior inferior right liver lobe. There is marked diffuse intrahepatic biliary ductal dilatation, more prominent in the left than right liver lobes. There is a 1.7 x 1.5 x 1.2 cm mass centered at the common bile duct bifurcation (series 1404/ image 47), which demonstrates delayed hyperenhancement. Cholecystectomy. Common bile duct is normal caliber (5 mm diameter) below the central biliary mass. Pancreas: No pancreas divisum. Mildly dilated ventral pancreatic duct up to 4 mm diameter in the pancreatic head. Main pancreatic duct is normal caliber (2 mm diameter). No pancreatic mass. Spleen: Normal size. No mass. Adrenals/Urinary Tract: Normal adrenals. No hydronephrosis. There is an exophytic 3.1 x 2.8 cm renal cortical lesion in the lateral interpolar left kidney (series 4/ image 36), which demonstrates heterogeneous precontrast T1 hyperintensity and no convincing enhancement, consistent with a Bosniak category 2  hemorrhagic/proteinaceous renal cyst. There are 2 additional smaller complex renal cysts in the anterior upper left kidney demonstrating heterogeneous precontrast T1 hyperintensity and no appreciable enhancement, also compatible with Bosniak category 2 hemorrhagic/ proteinaceous renal cysts. Numerous simple renal cysts are present in both kidneys, largest 2.7 cm in the anterior upper left kidney. Stomach/Bowel: Grossly normal stomach. Visualized small and large bowel is normal caliber, with no bowel wall thickening. Vascular/Lymphatic: Atherosclerotic nonaneurysmal abdominal aorta. Patent main portal, right portal, splenic, hepatic and renal veins. There is narrowing of the left portal vein as it courses adjacent to the central biliary mass. No pathologically enlarged lymph nodes in the abdomen. Other: No abdominal ascites or focal fluid collection. Musculoskeletal: No aggressive appearing focal osseous lesions. Small T2 hyperintense bone lesion in the left T11 vertebral body is stable since 2007 CT study, consistent with a benign lesion, probably  a hemangioma. IMPRESSION: 1. Marked diffuse intrahepatic biliary ductal dilatation, most prominent in the left liver lobe, which demonstrates relative atrophy. Mass at the common bile duct bifurcation measures 1.7 x 1.5 x 1.2 cm and demonstrates delayed hyperenhancement, consistent with a hilar cholangiocarcinoma (Klatskin tumor). 2. Narrowing of the left portal vein as it courses adjacent to the central biliary mass. 3. No evidence of metastatic disease in the abdomen. 4. Additional findings include aortic atherosclerosis, minimal diffuse hepatic steatosis and Bosniak category 1 and category 2 renal cysts. Electronically Signed   By: Ilona Sorrel M.D.   On: 07/07/2016 09:10   Mr Jeananne Rama W/wo Cm/mrcp  Result Date: 07/07/2016 CLINICAL DATA:  75 year old male inpatient with obstructive jaundice, marked intrahepatic biliary ductal dilatation and suggestion of a central  biliary mass on CT study from 1 day prior. Total bilirubin 11.3. EXAM: MRI ABDOMEN WITHOUT AND WITH CONTRAST (INCLUDING MRCP) TECHNIQUE: Multiplanar multisequence MR imaging of the abdomen was performed both before and after the administration of intravenous contrast. Heavily T2-weighted images of the biliary and pancreatic ducts were obtained, and three-dimensional MRCP images were rendered by post processing. CONTRAST:  77mL MULTIHANCE GADOBENATE DIMEGLUMINE 529 MG/ML IV SOLN COMPARISON:  07/05/2016 CT abdomen/ pelvis. FINDINGS: Lower chest: Mild scarring versus atelectasis in the dependent right lung base. Hepatobiliary: There is relative atrophy of the left liver lobe. Normal size right liver lobe. Minimal diffuse hepatic steatosis. There are 2 subcentimeter simple liver cysts in the anterior liver dome and anterior inferior right liver lobe. There is marked diffuse intrahepatic biliary ductal dilatation, more prominent in the left than right liver lobes. There is a 1.7 x 1.5 x 1.2 cm mass centered at the common bile duct bifurcation (series 1404/ image 47), which demonstrates delayed hyperenhancement. Cholecystectomy. Common bile duct is normal caliber (5 mm diameter) below the central biliary mass. Pancreas: No pancreas divisum. Mildly dilated ventral pancreatic duct up to 4 mm diameter in the pancreatic head. Main pancreatic duct is normal caliber (2 mm diameter). No pancreatic mass. Spleen: Normal size. No mass. Adrenals/Urinary Tract: Normal adrenals. No hydronephrosis. There is an exophytic 3.1 x 2.8 cm renal cortical lesion in the lateral interpolar left kidney (series 4/ image 36), which demonstrates heterogeneous precontrast T1 hyperintensity and no convincing enhancement, consistent with a Bosniak category 2 hemorrhagic/proteinaceous renal cyst. There are 2 additional smaller complex renal cysts in the anterior upper left kidney demonstrating heterogeneous precontrast T1 hyperintensity and no  appreciable enhancement, also compatible with Bosniak category 2 hemorrhagic/ proteinaceous renal cysts. Numerous simple renal cysts are present in both kidneys, largest 2.7 cm in the anterior upper left kidney. Stomach/Bowel: Grossly normal stomach. Visualized small and large bowel is normal caliber, with no bowel wall thickening. Vascular/Lymphatic: Atherosclerotic nonaneurysmal abdominal aorta. Patent main portal, right portal, splenic, hepatic and renal veins. There is narrowing of the left portal vein as it courses adjacent to the central biliary mass. No pathologically enlarged lymph nodes in the abdomen. Other: No abdominal ascites or focal fluid collection. Musculoskeletal: No aggressive appearing focal osseous lesions. Small T2 hyperintense bone lesion in the left T11 vertebral body is stable since 2007 CT study, consistent with a benign lesion, probably a hemangioma. IMPRESSION: 1. Marked diffuse intrahepatic biliary ductal dilatation, most prominent in the left liver lobe, which demonstrates relative atrophy. Mass at the common bile duct bifurcation measures 1.7 x 1.5 x 1.2 cm and demonstrates delayed hyperenhancement, consistent with a hilar cholangiocarcinoma (Klatskin tumor). 2. Narrowing of the left portal vein as it  courses adjacent to the central biliary mass. 3. No evidence of metastatic disease in the abdomen. 4. Additional findings include aortic atherosclerosis, minimal diffuse hepatic steatosis and Bosniak category 1 and category 2 renal cysts. Electronically Signed   By: Ilona Sorrel M.D.   On: 07/07/2016 09:10   Ir Int Lianne Cure Biliary Drain With Cholangiogram  Result Date: 07/07/2016 INDICATION: Obstructive jaundice, concern for Klatskin tumor by CT and MR. EXAM: IR INT-EXT BILIARY DRAIN W/ CHOLANGIOGRAM MEDICATIONS: 3.375 g Zosyn; The antibiotic was administered within an appropriate time frame prior to the initiation of the procedure. ANESTHESIA/SEDATION: Moderate (conscious) sedation was  employed during this procedure. A total of Versed 3.0 mg and Fentanyl 100 mcg was administered intravenously. Moderate Sedation Time: 23 minutes. The patient's level of consciousness and vital signs were monitored continuously by radiology nursing throughout the procedure under my direct supervision. FLUOROSCOPY TIME:  Fluoroscopy Time: 6 minutes 12 seconds (72 mGy). COMPLICATIONS: None immediate. PROCEDURE: Informed written consent was obtained from the patient after a thorough discussion of the procedural risks, benefits and alternatives. All questions were addressed. Maximal Sterile Barrier Technique was utilized including caps, mask, sterile gowns, sterile gloves, sterile drape, hand hygiene and skin antiseptic. A timeout was performed prior to the initiation of the procedure. Previous imaging reviewed. Patient positioned supine. Preliminary ultrasound performed of the liver from a subxiphoid approach. Dilated left hepatic ducts localized. Under sterile conditions and local anesthesia, ultrasound percutaneous needle access performed of a dilated left hepatic duct from a subxiphoid approach. Contrast injection confirms position within the duct and diffuse left biliary dilatation correlating with the prior imaging. 018 guidewire advanced followed by the Accustick dilator set. This allowed guidewire exchange to insert a 5 [place] Kumpe catheter. Catheter and Glidewire were advanced through the biliary confluence obstruction into the common bile duct. Contrast injection confirmed position in the common bile duct. Catheter and guidewire were advanced into the duodenum. Over the Amplatz guidewire, tract dilatation performed to advance a 10 [place] internal external biliary drain. Injection of the biliary drain confirms contrast filling both the right and left hepatic ducts at the biliary confluence obstruction. Therefore, initially only a single drain was placed. Catheter was secured with a Prolene suture. Biliary  system was decompressed by syringe aspiration. Gravity drainage bag connected followed by sterile dressing. Patient tolerated the procedure well. No immediate complication. IMPRESSION: Successful ultrasound and fluoroscopic 10 French left internal external biliary drain as above. Electronically Signed   By: Jerilynn Mages.  Shick M.D.   On: 07/07/2016 17:06   Scheduled Meds: . cholestyramine  4 g Oral BID  . Influenza vac split quadrivalent PF  0.5 mL Intramuscular Tomorrow-1000  . 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

## 2016-07-09 ENCOUNTER — Inpatient Hospital Stay (HOSPITAL_COMMUNITY): Payer: Medicare Other

## 2016-07-09 DIAGNOSIS — N400 Enlarged prostate without lower urinary tract symptoms: Secondary | ICD-10-CM | POA: Diagnosis present

## 2016-07-09 DIAGNOSIS — R338 Other retention of urine: Secondary | ICD-10-CM | POA: Diagnosis not present

## 2016-07-09 DIAGNOSIS — K859 Acute pancreatitis without necrosis or infection, unspecified: Secondary | ICD-10-CM

## 2016-07-09 LAB — CBC WITH DIFFERENTIAL/PLATELET
BASOS PCT: 0 %
Basophils Absolute: 0 10*3/uL (ref 0.0–0.1)
EOS ABS: 0 10*3/uL (ref 0.0–0.7)
Eosinophils Relative: 0 %
HEMATOCRIT: 40.3 % (ref 39.0–52.0)
HEMOGLOBIN: 14.1 g/dL (ref 13.0–17.0)
Lymphocytes Relative: 3 %
Lymphs Abs: 0.6 10*3/uL — ABNORMAL LOW (ref 0.7–4.0)
MCH: 29.3 pg (ref 26.0–34.0)
MCHC: 35 g/dL (ref 30.0–36.0)
MCV: 83.8 fL (ref 78.0–100.0)
Monocytes Absolute: 1.8 10*3/uL — ABNORMAL HIGH (ref 0.1–1.0)
Monocytes Relative: 8 %
NEUTROS ABS: 19.3 10*3/uL — AB (ref 1.7–7.7)
NEUTROS PCT: 89 %
Platelets: 197 10*3/uL (ref 150–400)
RBC: 4.81 MIL/uL (ref 4.22–5.81)
RDW: 16 % — ABNORMAL HIGH (ref 11.5–15.5)
WBC: 21.7 10*3/uL — AB (ref 4.0–10.5)

## 2016-07-09 LAB — COMPREHENSIVE METABOLIC PANEL
ALBUMIN: 2.8 g/dL — AB (ref 3.5–5.0)
ALK PHOS: 298 U/L — AB (ref 38–126)
ALT: 168 U/L — AB (ref 17–63)
AST: 162 U/L — AB (ref 15–41)
Anion gap: 8 (ref 5–15)
BILIRUBIN TOTAL: 17 mg/dL — AB (ref 0.3–1.2)
BUN: 20 mg/dL (ref 6–20)
CALCIUM: 8.3 mg/dL — AB (ref 8.9–10.3)
CO2: 26 mmol/L (ref 22–32)
CREATININE: 0.47 mg/dL — AB (ref 0.61–1.24)
Chloride: 103 mmol/L (ref 101–111)
GFR calc Af Amer: >60 mL/min (ref >60)
GFR calc non Af Amer: >60 mL/min (ref >60)
GLUCOSE: 82 mg/dL (ref 65–99)
Potassium: 4.5 mmol/L (ref 3.5–5.1)
SODIUM: 137 mmol/L (ref 135–145)
Total Protein: 5.8 g/dL — ABNORMAL LOW (ref 6.5–8.1)

## 2016-07-09 LAB — LIPASE, BLOOD: Lipase: 640 U/L — ABNORMAL HIGH (ref 11–51)

## 2016-07-09 MED ORDER — PIPERACILLIN-TAZOBACTAM 3.375 G IVPB
3.3750 g | Freq: Three times a day (TID) | INTRAVENOUS | Status: DC
  Administered 2016-07-09 – 2016-07-11 (×8): 3.375 g via INTRAVENOUS
  Filled 2016-07-09 (×8): qty 50

## 2016-07-09 MED ORDER — HYDROMORPHONE HCL 1 MG/ML IJ SOLN: 1.0000 mg | Freq: Once | INTRAMUSCULAR | Status: DC

## 2016-07-09 MED ORDER — METRONIDAZOLE 500 MG PO TABS: 500.0000 mg | ORAL_TABLET | Freq: Three times a day (TID) | ORAL | Status: DC

## 2016-07-09 MED ORDER — BISACODYL 10 MG RE SUPP: 10.0000 mg | Freq: Every day | RECTAL | Status: DC | PRN

## 2016-07-09 MED ORDER — METRONIDAZOLE IN NACL 5-0.79 MG/ML-% IV SOLN: 500.0000 mg | Freq: Three times a day (TID) | INTRAVENOUS | Status: DC

## 2016-07-09 NOTE — Progress Notes (Signed)
Pt with c/o fullness and inability to void. Bladder Scan done with 296 ml noted. MD paged and given update via phone and new orders given

## 2016-07-09 NOTE — Progress Notes (Addendum)
Pt had not urinated during shift. Bladder scan revealed pt was retaining 681 ml. On call MD Jani Gravel made aware. In and out cath was ordered and 700 ml of urine was collected. Will continue to monitor.

## 2016-07-09 NOTE — Progress Notes (Signed)
PROGRESS NOTE    THRISTAN DOWNIE  [id number]  DOB: 1941/08/16  DOA: 07/05/2016 PCP: Ocie Doyne, MD Outpatient Specialists:  Hospital course: SHAILEN UGALDE is a 75 y.o. male healthy comes in with 3 weeks of progressive worsening orange urine along with yellow skin and right sided abdominal dull achiness.  He does not associate the pain with eating or not.  No vomiting.  No nausea.  No weight loss.  CT shows ductal dilation with concern for mass, and his bili is over 11.  Pt referred for admission for biliary obstruction likely due to a mass.   Assessment & Plan:   1. 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  GI consulted IR for percutaneous biliary drain placement done on 10/12.  NPO for now as he developed acute pancreatitis.  Family leaning to going to consultation to Dr Dewaine Conger at [facility] with surgical oncology. Biliary fluid cultures pending.  2. Elevated LFTs and Hyperbilirubinemia - secondary to obstruction and biliary mass.  Bilirubin is trending higher, will defer to GI and surgery consultants as to plan of care.   3. Acute pancreatitis - occurred after biliary drain was placed, I called and spoke with IR physician who said that it is best treated conservately.   4. Leukocytosis - WBC trending higher, no fever or chills - will start IV zosyn.  5. Acute urinary retention - had I/O cath done last night, rechecked bladder scan this morning and 180 cc present, RN will rescan in a couple of hours and may need to place foley if still retaining.  Would start flomax when taking po better.   6. Epigastric abdominal pain and nausea - symptoms improving with treatment per patient. If persists, repeat CT abd with contrast.     DVT prophylaxis: SCDs, holding anticoagulants  Code Status: full Family Communication: wife bedside Disposition Plan: TBD  Consultants:  [facility]  General Surgery  Procedures: MRCP  Antimicrobials:  Zosyn IV   Subjective: Pt  feeling a little better, would like to try soup, abdomen still very tender.   Objective: Vitals:   07/08/16 0527 07/08/16 1417 07/08/16 2254 07/09/16 0557  BP: 139/67 (!) 163/71 (!) 154/71 (!) 148/74  Pulse: (!) 59 67 76 86  Resp: 18 20 18 18   Temp: 98.6 F (37 C) 98.6 F (37 C) 97.9 F (36.6 C) 98.7 F (37.1 C)  TempSrc: Oral Oral Oral Oral  SpO2: 98% 96% 96% 94%  Weight:      Height:        Intake/Output Summary (Last 24 hours) at 07/09/16 1012 Last data filed at 07/09/16 0915  Gross per 24 hour  Intake          1863.34 ml  Output             1050 ml  Net           813.34 ml   Filed Weights   07/05/16 1921 07/06/16 0227  Weight: 60 kg (132 lb 4 oz) 66.1 kg (145 lb 11.6 oz)    Exam:  General exam: well developed, well nourished, no distress, cooperative, icteric sclera bilateral Respiratory system: Clear. No increased work of breathing. Cardiovascular system: S1 & S2 heard, RRR. No JVD, murmurs, gallops, clicks or pedal edema. Gastrointestinal system: Abdomen is mildly distended, severe epigastric and RUQ TTP with some guarding.  Normal bowel sounds heard. Central nervous system: Alert and oriented. No focal neurological deficits. Extremities: no CCE.  Data Reviewed: Basic  Metabolic Panel:  Recent Labs Lab 07/05/16 1952 07/06/16 0530 07/07/16 0513 07/08/16 0836 07/09/16 0547  NA 138 139 137 134* 137  K 3.9 3.8 3.9 4.4 4.5  CL 103 105 105 101 103  CO2 29 27 26 25 26   GLUCOSE 92 96 109* 115* 82  BUN 13 13 15 20 20   CREATININE 0.74 0.60* 0.60* 0.52* 0.47*  CALCIUM 9.4 9.2 8.8* 9.1 8.3*   Liver Function Tests:  Recent Labs Lab 07/05/16 1952 07/06/16 0530 07/07/16 0513 07/08/16 0836 07/09/16 0547  AST 153* 147* 168* 170* 162*  ALT 195* 182* 181* 192* 168*  ALKPHOS 313* 296* 285* 328* 298*  BILITOT 9.2* 10.0* 11.3* 15.5* 17.0*  PROT 7.1 6.4* 6.0* 6.4* 5.8*  ALBUMIN 3.7 3.3* 3.1* 3.4* 2.8*    Recent Labs Lab 07/05/16 1952 07/08/16 1319  07/09/16 0547  LIPASE 52* 3,781* 640*   No results for input(s): AMMONIA in the last 168 hours. CBC:  Recent Labs Lab 07/05/16 1952 07/06/16 0530 07/08/16 0836 07/09/16 0547  WBC 6.5 5.6 12.9* 21.7*  NEUTROABS  --   --  11.3* 19.3*  HGB 12.9* 12.4* 13.8 14.1  HCT 37.4* 36.3* 39.2 40.3  MCV 86.2 85.4 83.9 83.8  PLT 185 169 168 197   Cardiac Enzymes: No results for input(s): CKTOTAL, CKMB, CKMBINDEX, TROPONINI in the last 168 hours. CBG (last 3)  No results for input(s): GLUCAP in the last 72 hours. Recent Results (from the past 240 hour(s))  Surgical PCR screen     Status: None   Collection Time: 07/06/16  2:48 AM  Result Value Ref Range Status   MRSA, PCR NEGATIVE NEGATIVE Final   Staphylococcus aureus NEGATIVE NEGATIVE Final    Comment:        The Xpert SA Assay (FDA approved for NASAL specimens in patients over 67 years of age), is one component of a comprehensive surveillance program.  Test performance has been validated by [facility] for patients greater than or equal to 57 year old. It is not intended to diagnose infection nor to guide or monitor treatment.   Body fluid culture     Status: None (Preliminary result)   Collection Time: 07/08/16 12:06 PM  Result Value Ref Range Status   Specimen Description BILE  Final   Special Requests Normal  Final   Gram Stain   Final    RARE WBC PRESENT, PREDOMINANTLY PMN ABUNDANT GRAM POSITIVE RODS MODERATE GRAM POSITIVE COCCI IN PAIRS AND CHAINS MODERATE GRAM NEGATIVE RODS Performed at [facility]    Culture PENDING  Incomplete   Report Status PENDING  Incomplete    Studies: Dg Abd Portable 1v  Result Date: 07/08/2016 CLINICAL DATA:  Right lower quadrant abdominal pain, nausea, vomiting. EXAM: PORTABLE ABDOMEN - 1 VIEW COMPARISON:  Radiographs of July 07, 2016. FINDINGS: The bowel gas pattern is normal. Biliary drainage catheter is again noted and unchanged in position. No abnormal calcifications are  noted. IMPRESSION: No evidence of bowel obstruction or ileus. Biliary drainage catheter in grossly good position. Electronically Signed   By: Marijo Conception, M.D.   On: 07/08/2016 08:51   Ir Int Lianne Cure Biliary Drain With Cholangiogram  Result Date: 07/07/2016 INDICATION: Obstructive jaundice, concern for Klatskin tumor by CT and MR. EXAM: IR INT-EXT BILIARY DRAIN W/ CHOLANGIOGRAM MEDICATIONS: 3.375 g Zosyn; The antibiotic was administered within an appropriate time frame prior to the initiation of the procedure. ANESTHESIA/SEDATION: Moderate (conscious) sedation was employed during this procedure. A total of  Versed 3.0 mg and Fentanyl 100 mcg was administered intravenously. Moderate Sedation Time: 23 minutes. The patient's level of consciousness and vital signs were monitored continuously by radiology nursing throughout the procedure under my direct supervision. FLUOROSCOPY TIME:  Fluoroscopy Time: 6 minutes 12 seconds (72 mGy). COMPLICATIONS: None immediate. PROCEDURE: Informed written consent was obtained from the patient after a thorough discussion of the procedural risks, benefits and alternatives. All questions were addressed. Maximal Sterile Barrier Technique was utilized including caps, mask, sterile gowns, sterile gloves, sterile drape, hand hygiene and skin antiseptic. A timeout was performed prior to the initiation of the procedure. Previous imaging reviewed. Patient positioned supine. Preliminary ultrasound performed of the liver from a subxiphoid approach. Dilated left hepatic ducts localized. Under sterile conditions and local anesthesia, ultrasound percutaneous needle access performed of a dilated left hepatic duct from a subxiphoid approach. Contrast injection confirms position within the duct and diffuse left biliary dilatation correlating with the prior imaging. 018 guidewire advanced followed by the Accustick dilator set. This allowed guidewire exchange to insert a 5 [place] Kumpe catheter.  Catheter and Glidewire were advanced through the biliary confluence obstruction into the common bile duct. Contrast injection confirmed position in the common bile duct. Catheter and guidewire were advanced into the duodenum. Over the Amplatz guidewire, tract dilatation performed to advance a 10 [place] internal external biliary drain. Injection of the biliary drain confirms contrast filling both the right and left hepatic ducts at the biliary confluence obstruction. Therefore, initially only a single drain was placed. Catheter was secured with a Prolene suture. Biliary system was decompressed by syringe aspiration. Gravity drainage bag connected followed by sterile dressing. Patient tolerated the procedure well. No immediate complication. IMPRESSION: Successful ultrasound and fluoroscopic 10 French left internal external biliary drain as above. Electronically Signed   By: Jerilynn Mages.  Shick M.D.   On: 07/07/2016 17:06   Scheduled Meds: . Influenza vac split quadrivalent PF  0.5 mL Intramuscular Tomorrow-1000  . piperacillin-tazobactam (ZOSYN)  IV  3.375 g Intravenous Q8H   Continuous Infusions: . 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

## 2016-07-09 NOTE — Progress Notes (Signed)
GASTROENTEROLOGY PROGRESS NOTE  Problem:   Intrahepatic biliary obstruction, presumed Klatskin's tumor. Post-PTC pancreatitis.  Subjective: Abdominal pain is more generalized than it was yesterday, but is probably somewhat less intense, with the help of pain medication. No nausea or vomiting. Patient seems somewhat frustrated by being in the hospital. Itching is better. Had significant urinary retention, now is status post Foley catheter placement.  Objective: Afebrile, no acute distress, appears slightly uncomfortable. Bowel sounds absent. Abdomen moderately distended, significantly tympanitic.  Labs pertinent for elevated CA-19-9 level of 291, marked overnight improvement in lipase (fell from 3800 to 640), significant rise in white count from 13,000-22,000, and mild rise in bilirubin with other liver chemistries marginally improved.  Assessment: 1. No dramatic benefit yet, in terms of liver chemistries, from PTC. However, I anticipate that the labs have "crested" and we may see improvement in the next one to 2 days.  2. Acute pancreatitis, rapid biochemical improvement but evidence of significant residual inflammation as evidenced by high white count and ongoing pain.  Plan: 1. Monitor LFTs 2. Patient is desirous of eventual surgical consultation at [facility] 3. Continue nothing by mouth for now. If not significantly improved in the next 1- 2 days, consider tube feeding placement. 4. Discussed issues of nutrition, pain, pancreatitis, etc. with wife and daughters at the bedside.   Cleotis Nipper, M.D. 07/09/2016 6:20 PM  Pager [phone] If no answer or after 5 PM call [phone]

## 2016-07-09 NOTE — Progress Notes (Signed)
Pt able to stand and void 100 cc dark urine. Bladder scan done post void showed 180 ml. Will continue to monitor urinary output

## 2016-07-09 NOTE — Progress Notes (Signed)
Per MD Order Foley Catheter inserted with immediate return of 400 cc dark urine. Pt tolerated procedure well

## 2016-07-10 ENCOUNTER — Encounter (HOSPITAL_COMMUNITY): Payer: Self-pay | Admitting: Radiology

## 2016-07-10 ENCOUNTER — Inpatient Hospital Stay (HOSPITAL_COMMUNITY): Payer: Medicare Other

## 2016-07-10 DIAGNOSIS — K567 Ileus, unspecified: Secondary | ICD-10-CM

## 2016-07-10 DIAGNOSIS — R338 Other retention of urine: Secondary | ICD-10-CM

## 2016-07-10 DIAGNOSIS — N400 Enlarged prostate without lower urinary tract symptoms: Secondary | ICD-10-CM

## 2016-07-10 LAB — CBC WITH DIFFERENTIAL/PLATELET
BASOS ABS: 0 10*3/uL (ref 0.0–0.1)
BASOS PCT: 0 %
Eosinophils Absolute: 0 10*3/uL (ref 0.0–0.7)
Eosinophils Relative: 0 %
HEMATOCRIT: 36.8 % — AB (ref 39.0–52.0)
HEMOGLOBIN: 13.2 g/dL (ref 13.0–17.0)
LYMPHS PCT: 4 %
Lymphs Abs: 0.7 10*3/uL (ref 0.7–4.0)
MCH: 30.4 pg (ref 26.0–34.0)
MCHC: 35.9 g/dL (ref 30.0–36.0)
MCV: 84.8 fL (ref 78.0–100.0)
MONO ABS: 1.7 10*3/uL — AB (ref 0.1–1.0)
MONOS PCT: 9 %
NEUTROS ABS: 15.6 10*3/uL — AB (ref 1.7–7.7)
NEUTROS PCT: 87 %
Platelets: 184 10*3/uL (ref 150–400)
RBC: 4.34 MIL/uL (ref 4.22–5.81)
RDW: 16.2 % — AB (ref 11.5–15.5)
WBC: 17.9 10*3/uL — ABNORMAL HIGH (ref 4.0–10.5)

## 2016-07-10 LAB — COMPREHENSIVE METABOLIC PANEL
ALBUMIN: 2.4 g/dL — AB (ref 3.5–5.0)
ALT: 141 U/L — ABNORMAL HIGH (ref 17–63)
ANION GAP: 5 (ref 5–15)
AST: 134 U/L — AB (ref 15–41)
Alkaline Phosphatase: 257 U/L — ABNORMAL HIGH (ref 38–126)
BUN: 23 mg/dL — AB (ref 6–20)
CHLORIDE: 108 mmol/L (ref 101–111)
CO2: 26 mmol/L (ref 22–32)
Calcium: 8.1 mg/dL — ABNORMAL LOW (ref 8.9–10.3)
Creatinine, Ser: 0.38 mg/dL — ABNORMAL LOW (ref 0.61–1.24)
GFR calc Af Amer: >60 mL/min (ref >60)
GFR calc non Af Amer: >60 mL/min (ref >60)
GLUCOSE: 70 mg/dL (ref 65–99)
POTASSIUM: 4.3 mmol/L (ref 3.5–5.1)
SODIUM: 139 mmol/L (ref 135–145)
TOTAL PROTEIN: 5.5 g/dL — AB (ref 6.5–8.1)
Total Bilirubin: 19.2 mg/dL (ref 0.3–1.2)

## 2016-07-10 LAB — LIPASE, BLOOD: Lipase: 344 U/L — ABNORMAL HIGH (ref 11–51)

## 2016-07-10 MED ORDER — IOPAMIDOL (ISOVUE-300) INJECTION 61%
30.0000 mL | Freq: Once | INTRAVENOUS | Status: AC | PRN
  Administered 2016-07-10: 30 mL via ORAL

## 2016-07-10 MED ORDER — IOPAMIDOL (ISOVUE-300) INJECTION 61%
80.0000 mL | Freq: Once | INTRAVENOUS | Status: AC | PRN
  Administered 2016-07-10: 80 mL via INTRAVENOUS

## 2016-07-10 MED ORDER — DEXTROSE-NACL 5-0.9 % IV SOLN
INTRAVENOUS | Status: DC
  Administered 2016-07-10 – 2016-07-11 (×4): via INTRAVENOUS

## 2016-07-10 MED ORDER — PHENOL 1.4 % MT LIQD
1.0000 | OROMUCOSAL | Status: DC | PRN
  Filled 2016-07-10: qty 177

## 2016-07-10 MED ORDER — BISACODYL 10 MG RE SUPP
10.0000 mg | Freq: Two times a day (BID) | RECTAL | Status: DC | PRN
  Administered 2016-07-10 – 2016-07-11 (×3): 10 mg via RECTAL
  Filled 2016-07-10 (×3): qty 1

## 2016-07-10 NOTE — Progress Notes (Signed)
PROGRESS NOTE   Jeremiah Warren  [id number]  DOB: 12-Aug-1941  DOA: 07/05/2016 PCP: Ocie Doyne, MD Outpatient Specialists:  Hospital course: Jeremiah Warren is a 75 y.o. male healthy comes in with 3 weeks of progressive worsening orange urine along with yellow skin and right sided abdominal dull achiness.  He does not associate the pain with eating or not.  No vomiting.  No nausea.  No weight loss.  CT shows ductal dilation with concern for mass, and his bili is over 11.  Pt referred for admission for biliary obstruction likely due to a mass.   Assessment & Plan:   1. 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  GI consulted IR for percutaneous biliary drain placement done on 10/12.  NPO for now as he developed acute pancreatitis and now ileus.  Family leaning to going to consultation to Dr Dewaine Conger at [facility] with surgical oncology. Biliary fluid cultures: ABUNDANT KLEBSIELLA ORNITHINOLYTICA SUSCEPTIBILITIES TO FOLLOW.  2. Elevated LFTs and Hyperbilirubinemia - secondary to obstruction and biliary mass.  Bilirubin is trending higher, will defer to GI and surgery consultants as to plan of care.   3. Acute pancreatitis - occurred after biliary drain was placed, I called and spoke with IR physician who said that it is best treated conservately. He has developed a mild ileus and NGT was placed last night.  Pt wants it removed, will check repeat abd xray and if improvement would consent to having NG removed per patient request.   4. Leukocytosis - WBC trending down now, no fever or chills - will continue IV zosyn.  5. Acute urinary retention - Placed foley on 10/14.  Would start flomax when taking po better.   6. Epigastric abdominal pain and nausea - secondary to developing ileus, hopefully not developing SBO, has not had BM yet, ordered suppository.  Repeating Abd Xray today.    DVT prophylaxis: SCDs, holding anticoagulants  Code Status: full Family Communication: daughter  at bedside Disposition Plan: TBD  Consultants:  [facility]  General Surgery  Procedures: MRCP  Antimicrobials:  Zosyn IV   Subjective: Pt really wants to have the NGT removed, no much output from it and not feeling any different since having it placed.    Objective: Vitals:   07/09/16 0557 07/09/16 1311 07/09/16 2247 07/10/16 0358  BP: (!) 148/74 118/70 133/77 127/72  Pulse: 86 81 92 91  Resp: 18 16 16 18   Temp: 98.7 F (37.1 C) 98.4 F (36.9 C) 98.6 F (37 C) 99.6 F (37.6 C)  TempSrc: Oral Oral Oral Oral  SpO2: 94% 95% 96% 93%  Weight:      Height:        Intake/Output Summary (Last 24 hours) at 07/10/16 0924 Last data filed at 07/10/16 [id number]  Gross per 24 hour  Intake          2816.67 ml  Output             1585 ml  Net          1231.67 ml   Filed Weights   07/05/16 1921 07/06/16 0227  Weight: 60 kg (132 lb 4 oz) 66.1 kg (145 lb 11.6 oz)    Exam:  General exam: Pt appears more jaundiced, well developed, well nourished, no distress, cooperative, icteric sclera bilateral Respiratory system: Clear. No increased work of breathing. Cardiovascular system: S1 & S2 heard, RRR. No JVD, murmurs, gallops, clicks or pedal edema. Gastrointestinal system: Abdomen is mildly distended  but soft, hypoactive bowel sounds, severe epigastric and RUQ TTP with some guarding. Central nervous system: Alert and oriented. No focal neurological deficits. Extremities: no CCE.  Data Reviewed: Basic Metabolic Panel:  Recent Labs Lab 07/06/16 0530 07/07/16 0513 07/08/16 0836 07/09/16 0547 07/10/16 0458  NA 139 137 134* 137 139  K 3.8 3.9 4.4 4.5 4.3  CL 105 105 101 103 108  CO2 27 26 25 26 26   GLUCOSE 96 109* 115* 82 70  BUN 13 15 20 20  23*  CREATININE 0.60* 0.60* 0.52* 0.47* 0.38*  CALCIUM 9.2 8.8* 9.1 8.3* 8.1*   Liver Function Tests:  Recent Labs Lab 07/06/16 0530 07/07/16 0513 07/08/16 0836 07/09/16 0547 07/10/16 0458  AST 147* 168* 170* 162* 134*  ALT 182*  181* 192* 168* 141*  ALKPHOS 296* 285* 328* 298* 257*  BILITOT 10.0* 11.3* 15.5* 17.0* 19.2*  PROT 6.4* 6.0* 6.4* 5.8* 5.5*  ALBUMIN 3.3* 3.1* 3.4* 2.8* 2.4*    Recent Labs Lab 07/05/16 1952 07/08/16 1319 07/09/16 0547 07/10/16 0458  LIPASE 52* 3,781* 640* 344*   No results for input(s): AMMONIA in the last 168 hours. CBC:  Recent Labs Lab 07/05/16 1952 07/06/16 0530 07/08/16 0836 07/09/16 0547 07/10/16 0458  WBC 6.5 5.6 12.9* 21.7* 17.9*  NEUTROABS  --   --  11.3* 19.3* 15.6*  HGB 12.9* 12.4* 13.8 14.1 13.2  HCT 37.4* 36.3* 39.2 40.3 36.8*  MCV 86.2 85.4 83.9 83.8 84.8  PLT 185 169 168 197 184   Cardiac Enzymes: No results for input(s): CKTOTAL, CKMB, CKMBINDEX, TROPONINI in the last 168 hours. CBG (last 3)  No results for input(s): GLUCAP in the last 72 hours. Recent Results (from the past 240 hour(s))  Surgical PCR screen     Status: None   Collection Time: 07/06/16  2:48 AM  Result Value Ref Range Status   MRSA, PCR NEGATIVE NEGATIVE Final   Staphylococcus aureus NEGATIVE NEGATIVE Final    Comment:        The Xpert SA Assay (FDA approved for NASAL specimens in patients over 73 years of age), is one component of a comprehensive surveillance program.  Test performance has been validated by [facility] for patients greater than or equal to 65 year old. It is not intended to diagnose infection nor to guide or monitor treatment.   Body fluid culture     Status: None (Preliminary result)   Collection Time: 07/08/16 12:06 PM  Result Value Ref Range Status   Specimen Description BILE  Final   Special Requests Normal  Final   Gram Stain   Final    RARE WBC PRESENT, PREDOMINANTLY PMN ABUNDANT GRAM POSITIVE RODS MODERATE GRAM POSITIVE COCCI IN PAIRS AND CHAINS MODERATE GRAM NEGATIVE RODS    Culture   Final    ABUNDANT KLEBSIELLA ORNITHINOLYTICA SUSCEPTIBILITIES TO FOLLOW Performed at [facility]    Report Status PENDING  Incomplete     Studies: Dg Abd Portable 1v  Result Date: 07/09/2016 CLINICAL DATA:  Abdominal distention and pain today. EXAM: PORTABLE ABDOMEN - 1 VIEW COMPARISON:  07/08/2016 and prior studies FINDINGS: Mildly distended small bowel loops within the lower abdomen are now noted. Gas and stool throughout the colon identified. A biliary drain is noted and unchanged. No suspicious calcifications are identified. IMPRESSION: Mildly distended nonspecific small bowel loops within the lower abdomen. This may represent developing small bowel obstruction or focal ileus. Electronically Signed   By: Margarette Canada M.D.   On: 07/09/2016 20:21  Scheduled Meds: .  HYDROmorphone (DILAUDID) injection  1 mg Intravenous Once  . Influenza vac split quadrivalent PF  0.5 mL Intramuscular Tomorrow-1000  . piperacillin-tazobactam (ZOSYN)  IV  3.375 g Intravenous Q8H   Continuous Infusions: . 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

## 2016-07-10 NOTE — Progress Notes (Signed)
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  Post-PTC pancreatitis.  Subjective: Feels about the same as yesterday. The patient had a KUB yesterday that showed an ileus, so an NG tube was placed yesterday evening. He hates it, and does not feel it has helped him.  Objective: Afebrile. Lying in bed in no evident distress. Rather flat, frustrated affect. Attentive family at bedside. Abdomen is mildly distended, sparse bowel sounds are present.   Minimal NG drainage in canister.   Follow-up KUB today shows resolution of small intestinal dilated loops although there is still a moderate amount of colonic gas consistent with colonic ileus (my interpretation).  Labs show a generally favorable trend (WBC down from 22K to 18K, LFT's have dropped about 20%, Lipase has dropped 50% to 344 over past 24hr), but bilirubin continues to climb (19, vs 17 yesterday).  Assessment: Biochemically improving post procedure pancreatitis, with ongoing residual symptomatology and rising bilirubin despite other LFTs improving.  Plan: 1. Continue current supportive care 2. Discussed with family and Dr. Wynetta Emery, hospitalist. I recommended a follow-up CT at this time to assess the severity of pancreatitis and look for any other complicating factors. 3. Repeat labs tomorrow. 4. Dr. Wynetta Emery indicates that the patient's family have identified a surgeon at [facility] whom they would like to consult on the patient for consideration of possible surgery. Whether the patient will improve to the point where this could be done as an outpatient clinic visit, versus a hospital to hospital transfer, is not yet clear.  Cleotis Nipper, M.D. 07/10/2016 9:18 AM  Pager [phone] If no answer or after 5 PM call [phone]

## 2016-07-10 NOTE — Progress Notes (Signed)
CRITICAL VALUE ALERT  Critical value received:  Total bilirubin 19.2  Date of notification:  07/10/2016  Time of notification:  0618  Critical value read back:Yes.    Nurse who received alert:  Ricki Miller   MD notified (1st page):  Maudie Mercury  Time of first page:  [phone]

## 2016-07-11 LAB — COMPREHENSIVE METABOLIC PANEL
ALBUMIN: 2.1 g/dL — AB (ref 3.5–5.0)
ALK PHOS: 214 U/L — AB (ref 38–126)
ALT: 108 U/L — AB (ref 17–63)
AST: 105 U/L — AB (ref 15–41)
Anion gap: 4 — ABNORMAL LOW (ref 5–15)
BUN: 17 mg/dL (ref 6–20)
CALCIUM: 8.1 mg/dL — AB (ref 8.9–10.3)
CHLORIDE: 111 mmol/L (ref 101–111)
CO2: 27 mmol/L (ref 22–32)
CREATININE: 0.49 mg/dL — AB (ref 0.61–1.24)
GFR calc Af Amer: >60 mL/min (ref >60)
GFR calc non Af Amer: >60 mL/min (ref >60)
GLUCOSE: 120 mg/dL — AB (ref 65–99)
Potassium: 3.7 mmol/L (ref 3.5–5.1)
SODIUM: 142 mmol/L (ref 135–145)
Total Bilirubin: 19.1 mg/dL (ref 0.3–1.2)
Total Protein: 4.8 g/dL — ABNORMAL LOW (ref 6.5–8.1)

## 2016-07-11 LAB — LIPASE, BLOOD: Lipase: 83 U/L — ABNORMAL HIGH (ref 11–51)

## 2016-07-11 LAB — CBC
HEMATOCRIT: 33.8 % — AB (ref 39.0–52.0)
Hemoglobin: 11.6 g/dL — ABNORMAL LOW (ref 13.0–17.0)
MCH: 29.1 pg (ref 26.0–34.0)
MCHC: 34.3 g/dL (ref 30.0–36.0)
MCV: 84.7 fL (ref 78.0–100.0)
PLATELETS: 188 10*3/uL (ref 150–400)
RBC: 3.99 MIL/uL — ABNORMAL LOW (ref 4.22–5.81)
RDW: 16.4 % — AB (ref 11.5–15.5)
WBC: 13.4 10*3/uL — AB (ref 4.0–10.5)

## 2016-07-11 MED ORDER — BISACODYL 10 MG RE SUPP: 10.0000 mg | Freq: Two times a day (BID) | RECTAL | 0 refills | Status: DC | PRN

## 2016-07-11 MED ORDER — CHOLESTYRAMINE 4 G PO PACK: 4.0000 g | PACK | Freq: Two times a day (BID) | ORAL | 12 refills | Status: DC | PRN

## 2016-07-11 MED ORDER — ONDANSETRON HCL 4 MG PO TABS: 4.0000 mg | ORAL_TABLET | Freq: Four times a day (QID) | ORAL | 0 refills | Status: DC | PRN

## 2016-07-11 MED ORDER — HYDROMORPHONE HCL 1 MG/ML IJ SOLN: 0.5000 mg | INTRAMUSCULAR | 0 refills | Status: DC | PRN

## 2016-07-11 MED ORDER — PIPERACILLIN-TAZOBACTAM 3.375 G IVPB: 3.3750 g | Freq: Three times a day (TID) | INTRAVENOUS | Status: DC

## 2016-07-11 NOTE — Progress Notes (Signed)
[facility] here to transport patient to [facility]

## 2016-07-11 NOTE — Evaluation (Signed)
Physical Therapy Evaluation Patient Details Name: Jeremiah Warren MRN: [id number] DOB: [birth year] Today's Date: 07/11/2016   History of Present Illness  Pt is a 75 year old male admitted with acute biliary obstruction - Biliary mass (Klatskin's tumor).    Clinical Impression  Pt admitted with above diagnosis. Pt currently with functional limitations due to the deficits listed below (see PT Problem List).  Pt presents with generalized weakness and deconditioning as he reports he has not been ambulating since admission (07/05/16).  Pt will benefit from skilled PT to increase their independence and safety with mobility to allow discharge to the venue listed below.  Pt reports spouse and family can provide assist at home if necessary.     Follow Up Recommendations Home health PT;Supervision for mobility/OOB    Equipment Recommendations  Rolling walker with 5" wheels    Recommendations for Other Services       Precautions / Restrictions Precautions Precautions: Fall Precaution Comments: R biliary drain Restrictions Weight Bearing Restrictions: No      Mobility  Bed Mobility Overal bed mobility: Needs Assistance Bed Mobility: Supine to Sit     Supine to sit: Min assist     General bed mobility comments: slight assist for trunk upright  Transfers Overall transfer level: Needs assistance Equipment used: Rolling walker (2 wheeled) Transfers: Sit to/from Stand Sit to Stand: Min guard         General transfer comment: min/guard for safety/lines/drains, verbal cues for safe technique  Ambulation/Gait Ambulation/Gait assistance: Min guard Ambulation Distance (Feet): 40 Feet Assistive device: Rolling walker (2 wheeled) Gait Pattern/deviations: Step-through pattern;Decreased stride length     General Gait Details: verbal cues for use of RW, pt only reports "weakness" as symptom during gait  Stairs            Wheelchair Mobility    Modified Rankin (Stroke  Patients Only)       Balance                                             Pertinent Vitals/Pain Pain Assessment: 0-10 Pain Score: 6  Pain Location: abdomen Pain Intervention(s): Monitored during session;Premedicated before session;Repositioned;Limited activity within patient's tolerance (pt requested RN premed for PT session)    Home Living Family/patient expects to be discharged to:: Private residence Living Arrangements: Spouse/significant other Available Help at Discharge: Family;Available 24 hours/day Type of Home: House Home Access: Ramped entrance     Home Layout: One level Home Equipment: None      Prior Function Level of Independence: Independent               Hand Dominance        Extremity/Trunk Assessment               Lower Extremity Assessment: Generalized weakness         Communication   Communication: No difficulties  Cognition Arousal/Alertness: Awake/alert Behavior During Therapy: WFL for tasks assessed/performed Overall Cognitive Status: Within Functional Limits for tasks assessed                      General Comments      Exercises     Assessment/Plan    PT Assessment Patient needs continued PT services  PT Problem List Decreased strength;Decreased activity tolerance;Decreased mobility;Pain;Decreased knowledge of use of DME  PT Treatment Interventions DME instruction;Gait training;Functional mobility training;Therapeutic exercise;Therapeutic activities;Patient/family education    PT Goals (Current goals can be found in the Care Plan section)  Acute Rehab PT Goals PT Goal Formulation: With patient Time For Goal Achievement: 07/25/16 Potential to Achieve Goals: Good    Frequency Min 3X/week   Barriers to discharge        Co-evaluation               End of Session Equipment Utilized During Treatment: Gait belt Activity Tolerance: Patient limited by fatigue Patient left:  in chair;with chair alarm set;with call bell/phone within reach;with family/visitor present Nurse Communication: Mobility status         Time: [id number] PT Time Calculation (min) (ACUTE ONLY): 12 min   Charges:   PT Evaluation $PT Eval Low Complexity: 1 Procedure     PT G Codes:        Wyvonne Carda,KATHrine E 07/11/2016, 11:54 AM Carmelia Bake, PT, DPT 07/11/2016 Pager: [phone]

## 2016-07-11 NOTE — Progress Notes (Signed)
Pt. Given Dulcolax suppository PRN at bedtime. Pt. Was assisted to [facility] [place] and had a BM. BM was formed soft, with some watery stool. It was brown and was a small amount. Pt. Was able to pass flatulence and burp. Abdomen distention went down and bowel sounds are audible. Will continue to monitor pt.

## 2016-07-11 NOTE — Progress Notes (Signed)
Report given to Shirlean Mylar, RN at [facility]. Called and updated unit on ETA of patients arrival to them.

## 2016-07-11 NOTE — Progress Notes (Signed)
[facility] Progress Note  Jeremiah Warren 75 y.o. 1941/01/19   Subjective: Abdominal pain ongoing. Moved a formed stool today.  Objective: Vital signs in last 24 hours: Vitals:   07/10/16 2110 07/11/16 0505  BP: (!) 130/53 132/67  Pulse: 86 83  Resp: 16 16  Temp: 99.3 F (37.4 C) 98.5 F (36.9 C)    Physical Exam: Gen: lethargic, thin, no acute distress HEENT: +scleral icterus CV: RRR Chest: CTA anteriorly Abd: diffuse tenderness with guarding, soft, nondistended, +BS, biliary drain noted (bandage not removed) with small amount of clear bile in bag  Lab Results:  Recent Labs  07/10/16 0458 07/11/16 0448  NA 139 142  K 4.3 3.7  CL 108 111  CO2 26 27  GLUCOSE 70 120*  BUN 23* 17  CREATININE 0.38* 0.49*  CALCIUM 8.1* 8.1*    Recent Labs  07/10/16 0458 07/11/16 0448  AST 134* 105*  ALT 141* 108*  ALKPHOS 257* 214*  BILITOT 19.2* 19.1*  PROT 5.5* 4.8*  ALBUMIN 2.4* 2.1*    Recent Labs  07/09/16 0547 07/10/16 0458 07/11/16 0448  WBC 21.7* 17.9* 13.4*  NEUTROABS 19.3* 15.6*  --   HGB 14.1 13.2 11.6*  HCT 40.3 36.8* 33.8*  MCV 83.8 84.8 84.7  PLT 197 184 188   No results for input(s): LABPROT, INR in the last 72 hours.    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 Internal/external Biliary drain in place. TB and improving WBC and transaminases. Patient needs inpt transfer to [facility] (Dr. Cloyd Stagers) since surgical options are desired. Dr. Wynetta Emery spoke with Dr. Cloyd Stagers, who has reportedly agreed to accept pt. In my opinion, patient is currently not able to be safely discharged and see [facility] as an outpt. Talked with daughter, son, and patient. D/W Dr. Wynetta Emery.   [place] C. 07/11/2016, 12:21 PM  Pager [phone]  If no answer or after 5 PM call [phone] ID: Jeremiah Warren, male   DOB: 10/23/1940, 74 y.o.   MRN: [id number]

## 2016-07-11 NOTE — Progress Notes (Signed)
Referring Physician(s): Margurite Auerbach  Supervising Physician: Jacqulynn Cadet  Patient Status:  [facility] - In-pt  Chief Complaint:  Biliary Obstruction/jaundice/possible Klatskin's tumor  Subjective:  Patient states no acute events overnight, family at bedside.  Admits to continued abdominal pain, intermittent nausea.  Denies fever, BM in last 24 hours.  Allergies: Percocet [oxycodone-acetaminophen]  Medications: Prior to Admission medications   Not on File     Vital Signs: BP 132/67 (BP Location: Left Arm)   Pulse 83   Temp 98.5 F (36.9 C) (Axillary)   Resp 16   Ht 5\' 7"  (1.702 m)   Wt 145 lb 11.6 oz (66.1 kg)   SpO2 92%   BMI 22.82 kg/m   Physical Exam Alert and oriented, resting in bed, bilary drain intact, flushes easily, output 325cc dark green bile, incision site dressed, clean and dry.  Imaging: Ct Abdomen Pelvis W Contrast  Result Date: 07/10/2016 CLINICAL DATA:  Acute pancreatitis EXAM: CT ABDOMEN AND PELVIS WITH CONTRAST TECHNIQUE: Multidetector CT imaging of the abdomen and pelvis was performed using the standard protocol following bolus administration of intravenous contrast. CONTRAST:  80 cc Isovue 300 intravenous. COMPARISON:  07/05/2016 FINDINGS: Lower chest: New small pleural effusions and atelectasis. New high-density material in the dependent right lower lobe. Hepatobiliary: Left-sided internalized biliary drain is in good position. Left-sided duct have been significantly decompressed. Right-sided ducts appear unchanged from prior. Hilar mass better demonstrated on recent CT and MRI. Pancreas: New peripancreatic edema and expansion without organized collection or necrosis. Spleen: Negative Adrenals/Urinary Tract: Negative adrenals. Left renal cortical cysts. No hydronephrosis. Urinary bladder is decompressed by Foley catheter which is in unremarkable position. Stomach/Bowel:  No obstruction. Appendectomy. Vascular/Lymphatic: No acute vascular abnormality.  No mass or adenopathy. Reproductive:No pathologic findings. Other: Small non loculated ascites, considered reactive. Musculoskeletal: No acute or aggressive finding. Sclerotic focus in the T11 vertebral body is stable from 2007 . IMPRESSION: 1. Interval acute pancreatitis without complicating feature. 2. Interval left-sided internalized biliary drain with decompressed left lobe ducts. Right lobe ducts are unchanged and presumably remain obstructed by a hilar mass. 3. New small pleural effusions and atelectasis. New high-density material in the right lower lobe, presumably interval aspiration. Electronically Signed   By: Monte Fantasia M.D.   On: 07/10/2016 15:58   Dg Abd Portable 1v  Result Date: 07/10/2016 CLINICAL DATA:  Ileus EXAM: PORTABLE ABDOMEN - 1 VIEW COMPARISON:  Plain film of the abdomen dated 07/09/2016 and plain film of the abdomen dated 07/08/2016. FINDINGS: The gas-filled loops of small bowel appreciated within the lower abdomen on yesterday's exam are less prominent today. Fairly large amount of gas persists in the colon. Pigtail catheter is stable position in the right abdomen. Enteric tube is in the stomach. Mild degenerative change noted within the lumbar spine. No acute or suspicious osseous finding. Surgical clips in the right upper quadrant are compatible with previous cholecystectomy IMPRESSION: 1. The mildly distended gas-filled loops of small bowel appreciated within the lower abdomen on yesterday's exam are less prominent today. No convincing evidence of a small bowel ileus on today's exam. 2. Fairly large amount of colonic gas persists suggesting some degree of colonic ileus. Electronically Signed   By: Franki Cabot M.D.   On: 07/10/2016 10:51   Dg Abd Portable 1v  Result Date: 07/09/2016 CLINICAL DATA:  Abdominal distention and pain today. EXAM: PORTABLE ABDOMEN - 1 VIEW COMPARISON:  07/08/2016 and prior studies FINDINGS: Mildly distended small bowel loops within the lower  abdomen  are now noted. Gas and stool throughout the colon identified. A biliary drain is noted and unchanged. No suspicious calcifications are identified. IMPRESSION: Mildly distended nonspecific small bowel loops within the lower abdomen. This may represent developing small bowel obstruction or focal ileus. Electronically Signed   By: Margarette Canada M.D.   On: 07/09/2016 20:21   Dg Abd Portable 1v  Result Date: 07/08/2016 CLINICAL DATA:  Right lower quadrant abdominal pain, nausea, vomiting. EXAM: PORTABLE ABDOMEN - 1 VIEW COMPARISON:  Radiographs of July 07, 2016. FINDINGS: The bowel gas pattern is normal. Biliary drainage catheter is again noted and unchanged in position. No abnormal calcifications are noted. IMPRESSION: No evidence of bowel obstruction or ileus. Biliary drainage catheter in grossly good position. Electronically Signed   By: Marijo Conception, M.D.   On: 07/08/2016 08:51   Ir Int Lianne Cure Biliary Drain With Cholangiogram  Result Date: 07/07/2016 INDICATION: Obstructive jaundice, concern for Klatskin tumor by CT and MR. EXAM: IR INT-EXT BILIARY DRAIN W/ CHOLANGIOGRAM MEDICATIONS: 3.375 g Zosyn; The antibiotic was administered within an appropriate time frame prior to the initiation of the procedure. ANESTHESIA/SEDATION: Moderate (conscious) sedation was employed during this procedure. A total of Versed 3.0 mg and Fentanyl 100 mcg was administered intravenously. Moderate Sedation Time: 23 minutes. The patient's level of consciousness and vital signs were monitored continuously by radiology nursing throughout the procedure under my direct supervision. FLUOROSCOPY TIME:  Fluoroscopy Time: 6 minutes 12 seconds (72 mGy). COMPLICATIONS: None immediate. PROCEDURE: Informed written consent was obtained from the patient after a thorough discussion of the procedural risks, benefits and alternatives. All questions were addressed. Maximal Sterile Barrier Technique was utilized including caps, mask, sterile  gowns, sterile gloves, sterile drape, hand hygiene and skin antiseptic. A timeout was performed prior to the initiation of the procedure. Previous imaging reviewed. Patient positioned supine. Preliminary ultrasound performed of the liver from a subxiphoid approach. Dilated left hepatic ducts localized. Under sterile conditions and local anesthesia, ultrasound percutaneous needle access performed of a dilated left hepatic duct from a subxiphoid approach. Contrast injection confirms position within the duct and diffuse left biliary dilatation correlating with the prior imaging. 018 guidewire advanced followed by the Accustick dilator set. This allowed guidewire exchange to insert a 5 [place] Kumpe catheter. Catheter and Glidewire were advanced through the biliary confluence obstruction into the common bile duct. Contrast injection confirmed position in the common bile duct. Catheter and guidewire were advanced into the duodenum. Over the Amplatz guidewire, tract dilatation performed to advance a 10 [place] internal external biliary drain. Injection of the biliary drain confirms contrast filling both the right and left hepatic ducts at the biliary confluence obstruction. Therefore, initially only a single drain was placed. Catheter was secured with a Prolene suture. Biliary system was decompressed by syringe aspiration. Gravity drainage bag connected followed by sterile dressing. Patient tolerated the procedure well. No immediate complication. IMPRESSION: Successful ultrasound and fluoroscopic 10 French left internal external biliary drain as above. Electronically Signed   By: Jerilynn Mages.  Shick M.D.   On: 07/07/2016 17:06    Labs:  CBC:  Recent Labs  07/08/16 0836 07/09/16 0547 07/10/16 0458 07/11/16 0448  WBC 12.9* 21.7* 17.9* 13.4*  HGB 13.8 14.1 13.2 11.6*  HCT 39.2 40.3 36.8* 33.8*  PLT 168 197 184 188    COAGS:  Recent Labs  07/07/16 0513  INR 0.91    BMP:  Recent Labs  07/08/16 0836  07/09/16 0547 07/10/16 0458 07/11/16 0448  NA 134* 137  139 142  K 4.4 4.5 4.3 3.7  CL 101 103 108 111  CO2 25 26 26 27   GLUCOSE 115* 82 70 120*  BUN 20 20 23* 17  CALCIUM 9.1 8.3* 8.1* 8.1*  CREATININE 0.52* 0.47* 0.38* 0.49*  GFRNONAA >60 >60 >60 >60  GFRAA >60 >60 >60 >60    LIVER FUNCTION TESTS:  Recent Labs  07/08/16 0836 07/09/16 0547 07/10/16 0458 07/11/16 0448  BILITOT 15.5* 17.0* 19.2* 19.1*  AST 170* 162* 134* 105*  ALT 192* 168* 141* 108*  ALKPHOS 328* 298* 257* 214*  PROT 6.4* 5.8* 5.5* 4.8*  ALBUMIN 3.4* 2.8* 2.4* 2.1*    Assessment and Plan:  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 S/p biliary drain on 10/12.  Continue with current drain, monitor output.  Pending transfer to [facility].  Electronically Signed: D. Rowe Robert 07/11/2016, 3:14 PM   I spent a total of 15 min at the the patient's bedside AND on the patient's hospital floor or unit, greater than 50% of which was counseling/coordinating care for Biliary drain.    Patient ID: Jeremiah Warren, male   DOB: 1941-07-26, 75 y.o.   MRN: [id number]

## 2016-07-11 NOTE — Care Management Note (Signed)
Case Management Note  Patient Details  Name: CHEVIS LOUNDS MRN: [id number] Date of Birth: 1941-03-29  Subjective/Objective: Noted for transfer to [facility]-Acute to acute transfer-Nsg to manage transport via [facility].                   Action/Plan:acute to acute transfer    Expected Discharge Date:                  Expected Discharge Plan:  Acute to Acute Transfer  In-House Referral:     Discharge planning Services  CM Consult  Post Acute Care Choice:    Choice offered to:     DME Arranged:    DME Agency:     HH Arranged:    HH Agency:     Status of Service:  In process, will continue to follow  If discussed at Long Length of Stay Meetings, dates discussed:    Additional Comments:  Dessa Phi, RN 07/11/2016, 3:12 PM

## 2016-07-11 NOTE — Progress Notes (Signed)
[facility] Progress Note     Subjective: Laying in bed, wife in room. Abdominal pain currently worse over upper abdomen and rated as 7/10. +nausea. Denies vomiting. Had loose BM yesterday. Not really ambulating.  Objective: Vital signs in last 24 hours: Temp:  [98.5 F (36.9 C)-99.3 F (37.4 C)] 98.5 F (36.9 C) (10/16 0505) Pulse Rate:  [83-86] 83 (10/16 0505) Resp:  [16] 16 (10/16 0505) BP: (130-150)/(53-67) 132/67 (10/16 0505) SpO2:  [90 %-96 %] 92 % (10/16 0505) Last BM Date: 07/07/16  Intake/Output from previous day: 10/15 0701 - 10/16 0700 In: 2301.7 [I.V.:2131.7; IV Piggyback:150] Out: 1941 [Urine:1725; Drains:215] Intake/Output this shift: No intake/output data recorded.  PE: Gen:  Alert, somnolent, cooperatve Abd: Soft, tender to light palpation of abdomen, mild distention, hypoactive BS,  Drain site C/D/I, bilious drainage   Lab Results:   Recent Labs  07/10/16 0458 07/11/16 0448  WBC 17.9* 13.4*  HGB 13.2 11.6*  HCT 36.8* 33.8*  PLT 184 188   BMET  Recent Labs  07/10/16 0458 07/11/16 0448  NA 139 142  K 4.3 3.7  CL 108 111  CO2 26 27  GLUCOSE 70 120*  BUN 23* 17  CREATININE 0.38* 0.49*  CALCIUM 8.1* 8.1*   PT/INR No results for input(s): LABPROT, INR in the last 72 hours. CMP     Component Value Date/Time   NA 142 07/11/2016 0448   K 3.7 07/11/2016 0448   CL 111 07/11/2016 0448   CO2 27 07/11/2016 0448   GLUCOSE 120 (H) 07/11/2016 0448   BUN 17 07/11/2016 0448   CREATININE 0.49 (L) 07/11/2016 0448   CALCIUM 8.1 (L) 07/11/2016 0448   PROT 4.8 (L) 07/11/2016 0448   ALBUMIN 2.1 (L) 07/11/2016 0448   AST 105 (H) 07/11/2016 0448   ALT 108 (H) 07/11/2016 0448   ALKPHOS 214 (H) 07/11/2016 0448   BILITOT 19.1 (HH) 07/11/2016 0448   GFRNONAA >60 07/11/2016 0448   GFRAA >60 07/11/2016 0448   Lipase     Component Value Date/Time   LIPASE 83 (H) 07/11/2016 0448   Studies/Results: Ct Abdomen Pelvis W Contrast  Result  Date: 07/10/2016 CLINICAL DATA:  Acute pancreatitis EXAM: CT ABDOMEN AND PELVIS WITH CONTRAST TECHNIQUE: Multidetector CT imaging of the abdomen and pelvis was performed using the standard protocol following bolus administration of intravenous contrast. CONTRAST:  80 cc Isovue 300 intravenous. COMPARISON:  07/05/2016 FINDINGS: Lower chest: New small pleural effusions and atelectasis. New high-density material in the dependent right lower lobe. Hepatobiliary: Left-sided internalized biliary drain is in good position. Left-sided duct have been significantly decompressed. Right-sided ducts appear unchanged from prior. Hilar mass better demonstrated on recent CT and MRI. Pancreas: New peripancreatic edema and expansion without organized collection or necrosis. Spleen: Negative Adrenals/Urinary Tract: Negative adrenals. Left renal cortical cysts. No hydronephrosis. Urinary bladder is decompressed by Foley catheter which is in unremarkable position. Stomach/Bowel:  No obstruction. Appendectomy. Vascular/Lymphatic: No acute vascular abnormality. No mass or adenopathy. Reproductive:No pathologic findings. Other: Small non loculated ascites, considered reactive. Musculoskeletal: No acute or aggressive finding. Sclerotic focus in the T11 vertebral body is stable from 2007 . IMPRESSION: 1. Interval acute pancreatitis without complicating feature. 2. Interval left-sided internalized biliary drain with decompressed left lobe ducts. Right lobe ducts are unchanged and presumably remain obstructed by a hilar mass. 3. New small pleural effusions and atelectasis. New high-density material in the right lower lobe, presumably interval aspiration. Electronically Signed   By: Neva Seat.D.  On: 07/10/2016 15:58   Dg Abd Portable 1v  Result Date: 07/10/2016 CLINICAL DATA:  Ileus EXAM: PORTABLE ABDOMEN - 1 VIEW COMPARISON:  Plain film of the abdomen dated 07/09/2016 and plain film of the abdomen dated 07/08/2016. FINDINGS:  The gas-filled loops of small bowel appreciated within the lower abdomen on yesterday's exam are less prominent today. Fairly large amount of gas persists in the colon. Pigtail catheter is stable position in the right abdomen. Enteric tube is in the stomach. Mild degenerative change noted within the lumbar spine. No acute or suspicious osseous finding. Surgical clips in the right upper quadrant are compatible with previous cholecystectomy IMPRESSION: 1. The mildly distended gas-filled loops of small bowel appreciated within the lower abdomen on yesterday's exam are less prominent today. No convincing evidence of a small bowel ileus on today's exam. 2. Fairly large amount of colonic gas persists suggesting some degree of colonic ileus. Electronically Signed   By: Franki Cabot M.D.   On: 07/10/2016 10:51   Dg Abd Portable 1v  Result Date: 07/09/2016 CLINICAL DATA:  Abdominal distention and pain today. EXAM: PORTABLE ABDOMEN - 1 VIEW COMPARISON:  07/08/2016 and prior studies FINDINGS: Mildly distended small bowel loops within the lower abdomen are now noted. Gas and stool throughout the colon identified. A biliary drain is noted and unchanged. No suspicious calcifications are identified. IMPRESSION: Mildly distended nonspecific small bowel loops within the lower abdomen. This may represent developing small bowel obstruction or focal ileus. Electronically Signed   By: Margarette Canada M.D.   On: 07/09/2016 20:21    Anti-infectives: Anti-infectives    Start     Dose/Rate Route Frequency Ordered Stop   07/09/16 1000  piperacillin-tazobactam (ZOSYN) IVPB 3.375 g     3.375 g 12.5 mL/hr over 240 Minutes Intravenous Every 8 hours 07/09/16 0842     07/09/16 0930  metroNIDAZOLE (FLAGYL) tablet 500 mg  Status:  Discontinued     500 mg Oral Every 8 hours 07/09/16 0901 07/09/16 0920   07/09/16 0845  metroNIDAZOLE (FLAGYL) IVPB 500 mg  Status:  Discontinued     500 mg 100 mL/hr over 60 Minutes Intravenous Every 8  hours 07/09/16 0842 07/09/16 0901   07/07/16 1215  piperacillin-tazobactam (ZOSYN) IVPB 3.375 g     3.375 g 50 mL/hr over 60 Minutes Intravenous To Radiology 07/07/16 1213 07/07/16 1723     Assessment/Plan Obstructive jaundice, 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 Prior abdominal surgical history includes cholecystectomy and appendectomy. - 3 weeks of progressive worsening orange urine, grey stools, yellow skin, weakness, and right sided abdominal achiness - CT scan showed dilated intrahepatic ducts, mass near biliary bifurcation, normal extrahepatic bile ducts - MRCP showed a mass at the common bile duct bifurcation measures 1.7 x 1.5 x 1.2 cm - 10 FR INT/EXT biliary drain placed to left biliary system, Dr. Annamaria Boots 07/07/16; Cultures positive for klebsiella orinthinolytica  - Lipase 83, AST 105, ALT 108, alk phos 214, t.bili 19.1 - WBC 13.4  trending down on IV Zosyn - Family plans to consult Dr. Dewaine Conger from [facility] with surgical oncology  Our general surgery service does not have any additional surgical options to offer this patient at the time - he needs consultation by surgical oncology for possible resection or treatment for Klatskin tumor.  Pain and nausea are stable, now having bowel function. Continue dulcolax BID PRN to stimulate bowel function. General surgery will sign off but will be available as needed for questions.  Thank you.  LOS: 5 days    Jill Alexanders , [facility] 07/11/2016, 9:16 AM Pager: [phone] Consults: [phone] Mon-Fri 7:00 am-4:30 pm Sat-Sun 7:00 am-11:30 am

## 2016-07-11 NOTE — Discharge Summary (Signed)
Physician Discharge Summary  Jeremiah Warren [id number] DOB: 20-Sep-1941 DOA: 07/05/2016  PCP: Ocie Doyne, MD  Admit date: 07/05/2016 Discharge date: 07/11/2016  Admitted From: Home  Disposition:  Transfer to [facility] Accepting MD: Della Goo  Discharge Condition: STABLE CODE STATUS: FULL Diet: clear liquid    Brief/Interim Summary: HPI: Jeremiah Warren is a 75 y.o. previously healthy male presented to [facility], [place] with 3 weeks of progressive orange colored urine along with yellowing of the skin and right sided abdominal dull achiness.  He did not associate the pain with eating.  He had No vomiting, No nausea, No weight loss.  CT scan performed in the ED showed ductal dilation with concern for mass, and his bilirubin was initially >9.  Pt was referred for admission for biliary obstruction likely due to a mass.  Hospital Course   Assessment & Plan:   1. 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  GI was consulted who recommended IR consult for percutaneous biliary drain placement done on 10/12.  Pt developed acute pancreatitis after biliary drain was placed with lipase levels >3700 and severe abdominal pain.  MRCP confirmed biliary mass - see report.  Family requested consultation to Dr Dewaine Conger at [facility] with surgical oncology.  Biliary fluid cultures: ABUNDANT KLEBSIELLA ORNITHINOLYTICA SUSCEPTIBILITIES BELOW (sensitive to zosyn).  I called and spoke with Dr. Cloyd Stagers at [facility] who accepted patient for transfer when bed available.  Pt medically stable for transfer.  Dr. Cloyd Stagers requested CD of images (Radiology says they have a new process now where they will electronically submit the information to [facility]).   2. Elevated LFTs and Hyperbilirubinemia - secondary to obstruction and biliary mass.  The bilirubin has peaked at 19.  Pt has not complained of uncontrolled pruritus.   3. Acute pancreatitis - Improving, lipase trending down daily, now at 83.  He  had developed a mild ileus shortly after developing pancreatitis but improved with bowel rest.  Pt has had BM this morning after a dulcolax suppository.  Started trial of clear liquids 10/16.  Pt seems to be tolerating this.   4. Leukocytosis - WBC trending down now, no fever or chills - will continue IV zosyn.  5. Acute urinary retention - Pt has long had difficulty with difficulty urinating prior to admission but had developed urinary retention in hospital requiring placement of foley.  Placed foley on 10/14.  Would consider flomax when taking po better.   6. Epigastric abdominal pain and nausea - slightly improved after BM. This is multifactorial secondary to pancreatitis and tumor.    DVT prophylaxis: SCDs, holding all anticoagulants  Code Status: full Family Communication: daughter and son at bedside Disposition Plan: Transfer to [facility] when bed available.    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   cholestyramine 4 g packet Commonly  known as:  QUESTRAN Take 1 packet (4 g total) by mouth 2 (two) times daily as needed (itching).   HYDROmorphone 1 MG/ML injection Commonly known as:  DILAUDID Inject 0.5 mLs (0.5 mg total) into the vein every 3 (three) hours as needed for severe pain.   ondansetron 4 MG tablet Commonly known as:  ZOFRAN Take 1 tablet (4 mg total) by mouth every 6 (six) hours as  needed for nausea.   piperacillin-tazobactam 3.375 GM/50ML IVPB Commonly known as:  ZOSYN Inject 50 mLs (3.375 g total) into the vein every 8 (eight) hours.       Allergies  Allergen Reactions  . Percocet [Oxycodone-Acetaminophen] Anxiety    Consultations:  [facility]  [facility]  Dr. Dewaine Conger - surgical oncology   Procedures/Studies: Ct Abdomen Pelvis W Contrast  Result Date: 07/10/2016 CLINICAL DATA:  Acute pancreatitis EXAM: CT ABDOMEN AND PELVIS WITH CONTRAST TECHNIQUE: Multidetector CT imaging of the abdomen and pelvis was performed using the standard protocol following bolus administration of intravenous contrast. CONTRAST:  80 cc Isovue 300 intravenous. COMPARISON:  07/05/2016 FINDINGS: Lower chest: New small pleural effusions and atelectasis. New high-density material in the dependent right lower lobe. Hepatobiliary: Left-sided internalized biliary drain is in good position. Left-sided duct have been significantly decompressed. Right-sided ducts appear unchanged from prior. Hilar mass better demonstrated on recent CT and MRI. Pancreas: New peripancreatic edema and expansion without organized collection or necrosis. Spleen: Negative Adrenals/Urinary Tract: Negative adrenals. Left renal cortical cysts. No hydronephrosis. Urinary bladder is decompressed by Foley catheter which is in unremarkable position. Stomach/Bowel:  No obstruction. Appendectomy. Vascular/Lymphatic: No acute vascular abnormality. No mass or adenopathy. Reproductive:No pathologic findings. Other: Small non loculated ascites, considered reactive. Musculoskeletal: No acute or aggressive finding. Sclerotic focus in the T11 vertebral body is stable from 2007 . IMPRESSION: 1. Interval acute pancreatitis without complicating feature. 2. Interval left-sided internalized biliary drain with decompressed left lobe ducts. Right lobe ducts are unchanged and presumably remain obstructed by a hilar mass. 3. New small  pleural effusions and atelectasis. New high-density material in the right lower lobe, presumably interval aspiration. Electronically Signed   By: Monte Fantasia M.D.   On: 07/10/2016 15:58   Ct Abdomen Pelvis W Contrast  Result Date: 07/05/2016 CLINICAL DATA:  Subacute onset of right lower quadrant abdominal pain and nausea. Initial encounter. EXAM: CT ABDOMEN AND PELVIS WITH CONTRAST TECHNIQUE: Multidetector CT imaging of the abdomen and pelvis was performed using the standard protocol following bolus administration of intravenous contrast. CONTRAST:  153mL ISOVUE-300 IOPAMIDOL (ISOVUE-300) INJECTION 61% COMPARISON:  CT of the abdomen and pelvis from 05/07/2011 FINDINGS: Lower chest: The visualized lung bases are grossly clear. The visualized portions of the mediastinum are unremarkable. Hepatobiliary: There is marked dilatation of the intrahepatic biliary ducts, particularly at the left hepatic lobe, though also to some extent within the inferior right hepatic lobe. On evaluation of coronal images, this is thought to reflect a mass at the proximal common bile duct along the edge of the liver, measuring approximately 2.4 x 1.7 x 1.7 cm, concerning for a cholangiocarcinoma. The common bile duct remains normal in caliber status post cholecystectomy. Clips are noted at the gallbladder fossa. Pancreas: The pancreas is within normal limits. Spleen: The spleen is unremarkable in appearance. Adrenals/Urinary Tract: The adrenal glands are unremarkable in appearance. Scattered bilateral renal cysts are noted, larger on the left. Nonspecific perinephric stranding is noted bilaterally. There is no evidence of hydronephrosis. No renal or ureteral stones are  identified. Stomach/Bowel: The stomach is unremarkable in appearance. The small bowel is within normal limits. The patient is status post appendectomy. The colon is unremarkable in appearance. Vascular/Lymphatic: Scattered calcification is seen along the abdominal  aorta and its branches. There is slight ectasia of the infrarenal abdominal aorta, without evidence of aneurysmal dilatation. The inferior vena cava is grossly unremarkable. No retroperitoneal lymphadenopathy is seen. No pelvic sidewall lymphadenopathy is identified. Reproductive: The prostate is mildly enlarged, measuring 5.1 cm in transverse dimension. The bladder is mildly distended and grossly unremarkable. Other: No additional soft tissue abnormalities are seen. Musculoskeletal: No acute osseous abnormalities are identified. Disc space narrowing is noted along the lower lumbar spine. The visualized musculature is unremarkable in appearance. IMPRESSION: 1. Marked dilatation of the intrahepatic biliary ducts, particularly at the left hepatic lobe, though also to some extent within the inferior right hepatic lobe. On evaluation of coronal images, this is thought to reflect a mass at the proximal common bile duct along the edge of the liver, measuring approximately 2.4 x 1.7 x 1.7 cm, concerning for cholangiocarcinoma. ERCP or MRCP is recommended for further evaluation. 2. Scattered bilateral renal cysts noted. 3. Scattered aortic atherosclerosis. 4. Mildly enlarged prostate. Electronically Signed   By: Garald Balding M.D.   On: 07/05/2016 23:56   Mr 3d Recon At Scanner  Result Date: 07/07/2016 CLINICAL DATA:  75 year old male inpatient with obstructive jaundice, marked intrahepatic biliary ductal dilatation and suggestion of a central biliary mass on CT study from 1 day prior. Total bilirubin 11.3. EXAM: MRI ABDOMEN WITHOUT AND WITH CONTRAST (INCLUDING MRCP) TECHNIQUE: Multiplanar multisequence MR imaging of the abdomen was performed both before and after the administration of intravenous contrast. Heavily T2-weighted images of the biliary and pancreatic ducts were obtained, and three-dimensional MRCP images were rendered by post processing. CONTRAST:  49mL MULTIHANCE GADOBENATE DIMEGLUMINE 529 MG/ML IV SOLN  COMPARISON:  07/05/2016 CT abdomen/ pelvis. FINDINGS: Lower chest: Mild scarring versus atelectasis in the dependent right lung base. Hepatobiliary: There is relative atrophy of the left liver lobe. Normal size right liver lobe. Minimal diffuse hepatic steatosis. There are 2 subcentimeter simple liver cysts in the anterior liver dome and anterior inferior right liver lobe. There is marked diffuse intrahepatic biliary ductal dilatation, more prominent in the left than right liver lobes. There is a 1.7 x 1.5 x 1.2 cm mass centered at the common bile duct bifurcation (series 1404/ image 47), which demonstrates delayed hyperenhancement. Cholecystectomy. Common bile duct is normal caliber (5 mm diameter) below the central biliary mass. Pancreas: No pancreas divisum. Mildly dilated ventral pancreatic duct up to 4 mm diameter in the pancreatic head. Main pancreatic duct is normal caliber (2 mm diameter). No pancreatic mass. Spleen: Normal size. No mass. Adrenals/Urinary Tract: Normal adrenals. No hydronephrosis. There is an exophytic 3.1 x 2.8 cm renal cortical lesion in the lateral interpolar left kidney (series 4/ image 36), which demonstrates heterogeneous precontrast T1 hyperintensity and no convincing enhancement, consistent with a Bosniak category 2 hemorrhagic/proteinaceous renal cyst. There are 2 additional smaller complex renal cysts in the anterior upper left kidney demonstrating heterogeneous precontrast T1 hyperintensity and no appreciable enhancement, also compatible with Bosniak category 2 hemorrhagic/ proteinaceous renal cysts. Numerous simple renal cysts are present in both kidneys, largest 2.7 cm in the anterior upper left kidney. Stomach/Bowel: Grossly normal stomach. Visualized small and large bowel is normal caliber, with no bowel wall thickening. Vascular/Lymphatic: Atherosclerotic nonaneurysmal abdominal aorta. Patent main portal, right portal, splenic, hepatic and renal veins. There  is narrowing of  the left portal vein as it courses adjacent to the central biliary mass. No pathologically enlarged lymph nodes in the abdomen. Other: No abdominal ascites or focal fluid collection. Musculoskeletal: No aggressive appearing focal osseous lesions. Small T2 hyperintense bone lesion in the left T11 vertebral body is stable since 2007 CT study, consistent with a benign lesion, probably a hemangioma. IMPRESSION: 1. Marked diffuse intrahepatic biliary ductal dilatation, most prominent in the left liver lobe, which demonstrates relative atrophy. Mass at the common bile duct bifurcation measures 1.7 x 1.5 x 1.2 cm and demonstrates delayed hyperenhancement, consistent with a hilar cholangiocarcinoma (Klatskin tumor). 2. Narrowing of the left portal vein as it courses adjacent to the central biliary mass. 3. No evidence of metastatic disease in the abdomen. 4. Additional findings include aortic atherosclerosis, minimal diffuse hepatic steatosis and Bosniak category 1 and category 2 renal cysts. Electronically Signed   By: Ilona Sorrel M.D.   On: 07/07/2016 09:10   Dg Abd Portable 1v  Result Date: 07/10/2016 CLINICAL DATA:  Ileus EXAM: PORTABLE ABDOMEN - 1 VIEW COMPARISON:  Plain film of the abdomen dated 07/09/2016 and plain film of the abdomen dated 07/08/2016. FINDINGS: The gas-filled loops of small bowel appreciated within the lower abdomen on yesterday's exam are less prominent today. Fairly large amount of gas persists in the colon. Pigtail catheter is stable position in the right abdomen. Enteric tube is in the stomach. Mild degenerative change noted within the lumbar spine. No acute or suspicious osseous finding. Surgical clips in the right upper quadrant are compatible with previous cholecystectomy IMPRESSION: 1. The mildly distended gas-filled loops of small bowel appreciated within the lower abdomen on yesterday's exam are less prominent today. No convincing evidence of a small bowel ileus on today's exam. 2.  Fairly large amount of colonic gas persists suggesting some degree of colonic ileus. Electronically Signed   By: Franki Cabot M.D.   On: 07/10/2016 10:51   Dg Abd Portable 1v  Result Date: 07/09/2016 CLINICAL DATA:  Abdominal distention and pain today. EXAM: PORTABLE ABDOMEN - 1 VIEW COMPARISON:  07/08/2016 and prior studies FINDINGS: Mildly distended small bowel loops within the lower abdomen are now noted. Gas and stool throughout the colon identified. A biliary drain is noted and unchanged. No suspicious calcifications are identified. IMPRESSION: Mildly distended nonspecific small bowel loops within the lower abdomen. This may represent developing small bowel obstruction or focal ileus. Electronically Signed   By: Margarette Canada M.D.   On: 07/09/2016 20:21   Dg Abd Portable 1v  Result Date: 07/08/2016 CLINICAL DATA:  Right lower quadrant abdominal pain, nausea, vomiting. EXAM: PORTABLE ABDOMEN - 1 VIEW COMPARISON:  Radiographs of July 07, 2016. FINDINGS: The bowel gas pattern is normal. Biliary drainage catheter is again noted and unchanged in position. No abnormal calcifications are noted. IMPRESSION: No evidence of bowel obstruction or ileus. Biliary drainage catheter in grossly good position. Electronically Signed   By: Marijo Conception, M.D.   On: 07/08/2016 08:51   Mr Jeananne Rama W/wo Cm/mrcp  Result Date: 07/07/2016 CLINICAL DATA:  76 year old male inpatient with obstructive jaundice, marked intrahepatic biliary ductal dilatation and suggestion of a central biliary mass on CT study from 1 day prior. Total bilirubin 11.3. EXAM: MRI ABDOMEN WITHOUT AND WITH CONTRAST (INCLUDING MRCP) TECHNIQUE: Multiplanar multisequence MR imaging of the abdomen was performed both before and after the administration of intravenous contrast. Heavily T2-weighted images of the biliary and pancreatic ducts were obtained, and three-dimensional MRCP  images were rendered by post processing. CONTRAST:  10mL MULTIHANCE  GADOBENATE DIMEGLUMINE 529 MG/ML IV SOLN COMPARISON:  07/05/2016 CT abdomen/ pelvis. FINDINGS: Lower chest: Mild scarring versus atelectasis in the dependent right lung base. Hepatobiliary: There is relative atrophy of the left liver lobe. Normal size right liver lobe. Minimal diffuse hepatic steatosis. There are 2 subcentimeter simple liver cysts in the anterior liver dome and anterior inferior right liver lobe. There is marked diffuse intrahepatic biliary ductal dilatation, more prominent in the left than right liver lobes. There is a 1.7 x 1.5 x 1.2 cm mass centered at the common bile duct bifurcation (series 1404/ image 47), which demonstrates delayed hyperenhancement. Cholecystectomy. Common bile duct is normal caliber (5 mm diameter) below the central biliary mass. Pancreas: No pancreas divisum. Mildly dilated ventral pancreatic duct up to 4 mm diameter in the pancreatic head. Main pancreatic duct is normal caliber (2 mm diameter). No pancreatic mass. Spleen: Normal size. No mass. Adrenals/Urinary Tract: Normal adrenals. No hydronephrosis. There is an exophytic 3.1 x 2.8 cm renal cortical lesion in the lateral interpolar left kidney (series 4/ image 36), which demonstrates heterogeneous precontrast T1 hyperintensity and no convincing enhancement, consistent with a Bosniak category 2 hemorrhagic/proteinaceous renal cyst. There are 2 additional smaller complex renal cysts in the anterior upper left kidney demonstrating heterogeneous precontrast T1 hyperintensity and no appreciable enhancement, also compatible with Bosniak category 2 hemorrhagic/ proteinaceous renal cysts. Numerous simple renal cysts are present in both kidneys, largest 2.7 cm in the anterior upper left kidney. Stomach/Bowel: Grossly normal stomach. Visualized small and large bowel is normal caliber, with no bowel wall thickening. Vascular/Lymphatic: Atherosclerotic nonaneurysmal abdominal aorta. Patent main portal, right portal, splenic, hepatic  and renal veins. There is narrowing of the left portal vein as it courses adjacent to the central biliary mass. No pathologically enlarged lymph nodes in the abdomen. Other: No abdominal ascites or focal fluid collection. Musculoskeletal: No aggressive appearing focal osseous lesions. Small T2 hyperintense bone lesion in the left T11 vertebral body is stable since 2007 CT study, consistent with a benign lesion, probably a hemangioma. IMPRESSION: 1. Marked diffuse intrahepatic biliary ductal dilatation, most prominent in the left liver lobe, which demonstrates relative atrophy. Mass at the common bile duct bifurcation measures 1.7 x 1.5 x 1.2 cm and demonstrates delayed hyperenhancement, consistent with a hilar cholangiocarcinoma (Klatskin tumor). 2. Narrowing of the left portal vein as it courses adjacent to the central biliary mass. 3. No evidence of metastatic disease in the abdomen. 4. Additional findings include aortic atherosclerosis, minimal diffuse hepatic steatosis and Bosniak category 1 and category 2 renal cysts. Electronically Signed   By: Ilona Sorrel M.D.   On: 07/07/2016 09:10   Ir Int Lianne Cure Biliary Drain With Cholangiogram  Result Date: 07/07/2016 INDICATION: Obstructive jaundice, concern for Klatskin tumor by CT and MR. EXAM: IR INT-EXT BILIARY DRAIN W/ CHOLANGIOGRAM MEDICATIONS: 3.375 g Zosyn; The antibiotic was administered within an appropriate time frame prior to the initiation of the procedure. ANESTHESIA/SEDATION: Moderate (conscious) sedation was employed during this procedure. A total of Versed 3.0 mg and Fentanyl 100 mcg was administered intravenously. Moderate Sedation Time: 23 minutes. The patient's level of consciousness and vital signs were monitored continuously by radiology nursing throughout the procedure under my direct supervision. FLUOROSCOPY TIME:  Fluoroscopy Time: 6 minutes 12 seconds (72 mGy). COMPLICATIONS: None immediate. PROCEDURE: Informed written consent was obtained  from the patient after a thorough discussion of the procedural risks, benefits and alternatives. All questions were addressed.  Maximal Sterile Barrier Technique was utilized including caps, mask, sterile gowns, sterile gloves, sterile drape, hand hygiene and skin antiseptic. A timeout was performed prior to the initiation of the procedure. Previous imaging reviewed. Patient positioned supine. Preliminary ultrasound performed of the liver from a subxiphoid approach. Dilated left hepatic ducts localized. Under sterile conditions and local anesthesia, ultrasound percutaneous needle access performed of a dilated left hepatic duct from a subxiphoid approach. Contrast injection confirms position within the duct and diffuse left biliary dilatation correlating with the prior imaging. 018 guidewire advanced followed by the Accustick dilator set. This allowed guidewire exchange to insert a 5 [place] Kumpe catheter. Catheter and Glidewire were advanced through the biliary confluence obstruction into the common bile duct. Contrast injection confirmed position in the common bile duct. Catheter and guidewire were advanced into the duodenum. Over the Amplatz guidewire, tract dilatation performed to advance a 10 [place] internal external biliary drain. Injection of the biliary drain confirms contrast filling both the right and left hepatic ducts at the biliary confluence obstruction. Therefore, initially only a single drain was placed. Catheter was secured with a Prolene suture. Biliary system was decompressed by syringe aspiration. Gravity drainage bag connected followed by sterile dressing. Patient tolerated the procedure well. No immediate complication. IMPRESSION: Successful ultrasound and fluoroscopic 10 French left internal external biliary drain as above. Electronically Signed   By: Jerilynn Mages.  Shick M.D.   On: 07/07/2016 17:06    Subjective: Pt tolerating clear liquid diet  Discharge Exam: Vitals:   07/11/16 0505 07/11/16  1544  BP: 132/67 (!) 144/68  Pulse: 83 69  Resp: 16 18  Temp: 98.5 F (36.9 C) 98.9 F (37.2 C)   Vitals:   07/10/16 1354 07/10/16 2110 07/11/16 0505 07/11/16 1544  BP: (!) 150/67 (!) 130/53 132/67 (!) 144/68  Pulse: 85 86 83 69  Resp: 16 16 16 18   Temp: 98.6 F (37 C) 99.3 F (37.4 C) 98.5 F (36.9 C) 98.9 F (37.2 C)  TempSrc: Oral Oral Axillary Oral  SpO2: 96% 90% 92% 93%  Weight:      Height:       General exam: Pt appears jaundiced, well developed, well nourished, no distress, cooperative, icteric sclera bilateral Respiratory system: Clear. No increased work of breathing. Cardiovascular system: S1 & S2 heard, RRR. No JVD, murmurs, gallops, clicks or pedal edema. Gastrointestinal system: Abdomen is mildly distended but soft, hypoactive bowel sounds, severe epigastric and RUQ TTP with some guarding. Central nervous system: Alert and oriented. No focal neurological deficits. Extremities: no CCE.  The results of significant diagnostics from this hospitalization (including imaging, microbiology, ancillary and laboratory) are listed below for reference.     Microbiology: Recent Results (from the past 240 hour(s))  Surgical PCR screen     Status: None   Collection Time: 07/06/16  2:48 AM  Result Value Ref Range Status   MRSA, PCR NEGATIVE NEGATIVE Final   Staphylococcus aureus NEGATIVE NEGATIVE Final    Comment:        The Xpert SA Assay (FDA approved for NASAL specimens in patients over 32 years of age), is one component of a comprehensive surveillance program.  Test performance has been validated by [facility] for patients greater than or equal to 43 year old. It is not intended to diagnose infection nor to guide or monitor treatment.   Body fluid culture     Status: None (Preliminary result)   Collection Time: 07/08/16 12:06 PM  Result Value Ref Range Status  Specimen Description BILE  Final   Special Requests Normal  Final   Gram Stain   Final    RARE  WBC PRESENT, PREDOMINANTLY PMN ABUNDANT GRAM POSITIVE RODS MODERATE GRAM POSITIVE COCCI IN PAIRS AND CHAINS MODERATE GRAM NEGATIVE RODS    Culture   Final    ABUNDANT KLEBSIELLA ORNITHINOLYTICA ABUNDANT VIRIDANS STREPTOCOCCUS SUSCEPTIBILITIES TO FOLLOW Performed at [facility]    Report Status PENDING  Incomplete   Organism ID, Bacteria KLEBSIELLA ORNITHINOLYTICA  Final      Susceptibility   Klebsiella ornithinolytica - MIC*    AMPICILLIN 16 RESISTANT Resistant     CEFAZOLIN <=4 SENSITIVE Sensitive     CEFEPIME <=1 SENSITIVE Sensitive     CEFTAZIDIME <=1 SENSITIVE Sensitive     CEFTRIAXONE <=1 SENSITIVE Sensitive     CIPROFLOXACIN <=0.25 SENSITIVE Sensitive     GENTAMICIN <=1 SENSITIVE Sensitive     IMIPENEM <=0.25 SENSITIVE Sensitive     TRIMETH/SULFA <=20 SENSITIVE Sensitive     AMPICILLIN/SULBACTAM <=2 SENSITIVE Sensitive     PIP/TAZO <=4 SENSITIVE Sensitive     * ABUNDANT KLEBSIELLA ORNITHINOLYTICA     Labs: BNP (last 3 results) No results for input(s): BNP in the last 8760 hours. Basic Metabolic Panel:  Recent Labs Lab 07/07/16 0513 07/08/16 0836 07/09/16 0547 07/10/16 0458 07/11/16 0448  NA 137 134* 137 139 142  K 3.9 4.4 4.5 4.3 3.7  CL 105 101 103 108 111  CO2 26 25 26 26 27   GLUCOSE 109* 115* 82 70 120*  BUN 15 20 20  23* 17  CREATININE 0.60* 0.52* 0.47* 0.38* 0.49*  CALCIUM 8.8* 9.1 8.3* 8.1* 8.1*   Liver Function Tests:  Recent Labs Lab 07/07/16 0513 07/08/16 0836 07/09/16 0547 07/10/16 0458 07/11/16 0448  AST 168* 170* 162* 134* 105*  ALT 181* 192* 168* 141* 108*  ALKPHOS 285* 328* 298* 257* 214*  BILITOT 11.3* 15.5* 17.0* 19.2* 19.1*  PROT 6.0* 6.4* 5.8* 5.5* 4.8*  ALBUMIN 3.1* 3.4* 2.8* 2.4* 2.1*    Recent Labs Lab 07/05/16 1952 07/08/16 1319 07/09/16 0547 07/10/16 0458 07/11/16 0448  LIPASE 52* 3,781* 640* 344* 83*   No results for input(s): AMMONIA in the last 168 hours. CBC:  Recent Labs Lab 07/06/16 0530  07/08/16 0836 07/09/16 0547 07/10/16 0458 07/11/16 0448  WBC 5.6 12.9* 21.7* 17.9* 13.4*  NEUTROABS  --  11.3* 19.3* 15.6*  --   HGB 12.4* 13.8 14.1 13.2 11.6*  HCT 36.3* 39.2 40.3 36.8* 33.8*  MCV 85.4 83.9 83.8 84.8 84.7  PLT 169 168 197 184 188   Cardiac Enzymes: No results for input(s): CKTOTAL, CKMB, CKMBINDEX, TROPONINI in the last 168 hours. BNP: Invalid input(s): POCBNP CBG: No results for input(s): GLUCAP in the last 168 hours. D-Dimer No results for input(s): DDIMER in the last 72 hours. Hgb A1c No results for input(s): HGBA1C in the last 72 hours. Lipid Profile No results for input(s): CHOL, HDL, LDLCALC, TRIG, CHOLHDL, LDLDIRECT in the last 72 hours. Thyroid function studies No results for input(s): TSH, T4TOTAL, T3FREE, THYROIDAB in the last 72 hours.  Invalid input(s): FREET3 Anemia work up No results for input(s): VITAMINB12, FOLATE, FERRITIN, TIBC, IRON, RETICCTPCT in the last 72 hours. Urinalysis    Component Value Date/Time   COLORURINE AMBER (A) 07/05/2016 2117   APPEARANCEUR CLEAR 07/05/2016 2117   LABSPEC 1.010 07/05/2016 2117   PHURINE 6.0 07/05/2016 2117   GLUCOSEU NEGATIVE 07/05/2016 2117   HGBUR NEGATIVE 07/05/2016 2117   BILIRUBINUR MODERATE (  A) 07/05/2016 2117   KETONESUR NEGATIVE 07/05/2016 2117   PROTEINUR NEGATIVE 07/05/2016 2117   NITRITE NEGATIVE 07/05/2016 2117   LEUKOCYTESUR NEGATIVE 07/05/2016 2117   Sepsis Labs Invalid input(s): PROCALCITONIN,  WBC,  LACTICIDVEN Microbiology Recent Results (from the past 240 hour(s))  Surgical PCR screen     Status: None   Collection Time: 07/06/16  2:48 AM  Result Value Ref Range Status   MRSA, PCR NEGATIVE NEGATIVE Final   Staphylococcus aureus NEGATIVE NEGATIVE Final    Comment:        The Xpert SA Assay (FDA approved for NASAL specimens in patients over 29 years of age), is one component of a comprehensive surveillance program.  Test performance has been validated by [facility] for  patients greater than or equal to 71 year old. It is not intended to diagnose infection nor to guide or monitor treatment.   Body fluid culture     Status: None (Preliminary result)   Collection Time: 07/08/16 12:06 PM  Result Value Ref Range Status   Specimen Description BILE  Final   Special Requests Normal  Final   Gram Stain   Final    RARE WBC PRESENT, PREDOMINANTLY PMN ABUNDANT GRAM POSITIVE RODS MODERATE GRAM POSITIVE COCCI IN PAIRS AND CHAINS MODERATE GRAM NEGATIVE RODS    Culture   Final    ABUNDANT KLEBSIELLA ORNITHINOLYTICA ABUNDANT VIRIDANS STREPTOCOCCUS SUSCEPTIBILITIES TO FOLLOW Performed at [facility]    Report Status PENDING  Incomplete   Organism ID, Bacteria KLEBSIELLA ORNITHINOLYTICA  Final      Susceptibility   Klebsiella ornithinolytica - MIC*    AMPICILLIN 16 RESISTANT Resistant     CEFAZOLIN <=4 SENSITIVE Sensitive     CEFEPIME <=1 SENSITIVE Sensitive     CEFTAZIDIME <=1 SENSITIVE Sensitive     CEFTRIAXONE <=1 SENSITIVE Sensitive     CIPROFLOXACIN <=0.25 SENSITIVE Sensitive     GENTAMICIN <=1 SENSITIVE Sensitive     IMIPENEM <=0.25 SENSITIVE Sensitive     TRIMETH/SULFA <=20 SENSITIVE Sensitive     AMPICILLIN/SULBACTAM <=2 SENSITIVE Sensitive     PIP/TAZO <=4 SENSITIVE Sensitive     * ABUNDANT KLEBSIELLA ORNITHINOLYTICA   Time coordinating discharge: 35 minutes  SIGNED:  Irwin Brakeman, MD  [facility] 07/11/2016, 4:24 PM Pager   If 7PM-7AM, please contact night-coverage [email or web address] Password [email or web address]

## 2016-07-11 NOTE — Progress Notes (Signed)
PROGRESS NOTE   NEO TARWATER  [id number]  DOB: 03/23/1941  DOA: 07/05/2016 PCP: Ocie Doyne, MD Outpatient Specialists:  Hospital course: Jeremiah Warren is a 75 y.o. male healthy comes in with 3 weeks of progressive worsening orange urine along with yellow skin and right sided abdominal dull achiness.  He does not associate the pain with eating or not.  No vomiting.  No nausea.  No weight loss.  CT shows ductal dilation with concern for mass, and his bili is over 11.  Pt referred for admission for biliary obstruction likely due to a mass.   Assessment & Plan:   1. 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  GI consulted IR for percutaneous biliary drain placement done on 10/12.  Family requested consultation to Dr Dewaine Conger at [facility] with surgical oncology.  Biliary fluid cultures: ABUNDANT KLEBSIELLA ORNITHINOLYTICA SUSCEPTIBILITIES TO FOLLOW.  I called and spoke with Dr. Cloyd Stagers at [facility] who accepted patient for transfer when bed available.  Pt needs to be sent with CD of all imaging done.  Asked PT to ambulate patient more today. 2. Elevated LFTs and Hyperbilirubinemia - secondary to obstruction and biliary mass.   3. Acute pancreatitis - Improving, lipase trending down daily, now at 83.  He has developed a mild ileus but has had BM this morning.  Start trial of clear liquids 10/16.  4. Leukocytosis - WBC trending down now, no fever or chills - will continue IV zosyn.  5. Acute urinary retention - Placed foley on 10/14.  Would start flomax when taking po better.   6. Epigastric abdominal pain and nausea - slightly improved after BM. This is multifactorial secondary to pancreatitis and tumor.    DVT prophylaxis: SCDs, holding all anticoagulants  Code Status: full Family Communication: daughter and son at bedside Disposition Plan: Transfer to [facility] when bed available.   Consultants:  [facility]  General Surgery  Procedures: MRCP  Antimicrobials:  Zosyn IV    Subjective: Pt had BM this morning, wants to try clear liquids.    Objective: Vitals:   07/10/16 0358 07/10/16 1354 07/10/16 2110 07/11/16 0505  BP: 127/72 (!) 150/67 (!) 130/53 132/67  Pulse: 91 85 86 83  Resp: 18 16 16 16   Temp: 99.6 F (37.6 C) 98.6 F (37 C) 99.3 F (37.4 C) 98.5 F (36.9 C)  TempSrc: Oral Oral Oral Axillary  SpO2: 93% 96% 90% 92%  Weight:      Height:        Intake/Output Summary (Last 24 hours) at 07/11/16 1212 Last data filed at 07/11/16 1202  Gross per 24 hour  Intake          2000.01 ml  Output             2360 ml  Net          -359.99 ml   Filed Weights   07/05/16 1921 07/06/16 0227  Weight: 60 kg (132 lb 4 oz) 66.1 kg (145 lb 11.6 oz)    Exam:  General exam: Pt appears jaundiced, well developed, well nourished, no distress, cooperative, icteric sclera bilateral Respiratory system: Clear. No increased work of breathing. Cardiovascular system: S1 & S2 heard, RRR. No JVD, murmurs, gallops, clicks or pedal edema. Gastrointestinal system: Abdomen is mildly distended but soft, hypoactive bowel sounds, severe epigastric and RUQ TTP with some guarding. Central nervous system: Alert and oriented. No focal neurological deficits. Extremities: no CCE.  Data Reviewed: Basic Metabolic Panel:  Recent Labs Lab 07/07/16 0513 07/08/16 0836 07/09/16 0547 07/10/16 0458 07/11/16 0448  NA 137 134* 137 139 142  K 3.9 4.4 4.5 4.3 3.7  CL 105 101 103 108 111  CO2 26 25 26 26 27   GLUCOSE 109* 115* 82 70 120*  BUN 15 20 20  23* 17  CREATININE 0.60* 0.52* 0.47* 0.38* 0.49*  CALCIUM 8.8* 9.1 8.3* 8.1* 8.1*   Liver Function Tests:  Recent Labs Lab 07/07/16 0513 07/08/16 0836 07/09/16 0547 07/10/16 0458 07/11/16 0448  AST 168* 170* 162* 134* 105*  ALT 181* 192* 168* 141* 108*  ALKPHOS 285* 328* 298* 257* 214*  BILITOT 11.3* 15.5* 17.0* 19.2* 19.1*  PROT 6.0* 6.4* 5.8* 5.5* 4.8*  ALBUMIN 3.1* 3.4* 2.8* 2.4* 2.1*    Recent Labs Lab  07/05/16 1952 07/08/16 1319 07/09/16 0547 07/10/16 0458 07/11/16 0448  LIPASE 52* 3,781* 640* 344* 83*   No results for input(s): AMMONIA in the last 168 hours. CBC:  Recent Labs Lab 07/06/16 0530 07/08/16 0836 07/09/16 0547 07/10/16 0458 07/11/16 0448  WBC 5.6 12.9* 21.7* 17.9* 13.4*  NEUTROABS  --  11.3* 19.3* 15.6*  --   HGB 12.4* 13.8 14.1 13.2 11.6*  HCT 36.3* 39.2 40.3 36.8* 33.8*  MCV 85.4 83.9 83.8 84.8 84.7  PLT 169 168 197 184 188   Cardiac Enzymes: No results for input(s): CKTOTAL, CKMB, CKMBINDEX, TROPONINI in the last 168 hours. CBG (last 3)  No results for input(s): GLUCAP in the last 72 hours. Recent Results (from the past 240 hour(s))  Surgical PCR screen     Status: None   Collection Time: 07/06/16  2:48 AM  Result Value Ref Range Status   MRSA, PCR NEGATIVE NEGATIVE Final   Staphylococcus aureus NEGATIVE NEGATIVE Final    Comment:        The Xpert SA Assay (FDA approved for NASAL specimens in patients over 50 years of age), is one component of a comprehensive surveillance program.  Test performance has been validated by [facility] for patients greater than or equal to 68 year old. It is not intended to diagnose infection nor to guide or monitor treatment.   Body fluid culture     Status: None (Preliminary result)   Collection Time: 07/08/16 12:06 PM  Result Value Ref Range Status   Specimen Description BILE  Final   Special Requests Normal  Final   Gram Stain   Final    RARE WBC PRESENT, PREDOMINANTLY PMN ABUNDANT GRAM POSITIVE RODS MODERATE GRAM POSITIVE COCCI IN PAIRS AND CHAINS MODERATE GRAM NEGATIVE RODS Performed at [facility]  Final   Report Status PENDING  Incomplete   Organism ID, Bacteria KLEBSIELLA ORNITHINOLYTICA  Final      Susceptibility   Klebsiella ornithinolytica - MIC*    AMPICILLIN 16 RESISTANT Resistant     CEFAZOLIN <=4 SENSITIVE Sensitive     CEFEPIME <=1  SENSITIVE Sensitive     CEFTAZIDIME <=1 SENSITIVE Sensitive     CEFTRIAXONE <=1 SENSITIVE Sensitive     CIPROFLOXACIN <=0.25 SENSITIVE Sensitive     GENTAMICIN <=1 SENSITIVE Sensitive     IMIPENEM <=0.25 SENSITIVE Sensitive     TRIMETH/SULFA <=20 SENSITIVE Sensitive     AMPICILLIN/SULBACTAM <=2 SENSITIVE Sensitive     PIP/TAZO <=4 SENSITIVE Sensitive     * ABUNDANT KLEBSIELLA ORNITHINOLYTICA    Studies: Ct Abdomen Pelvis W Contrast  Result Date: 07/10/2016 CLINICAL DATA:  Acute pancreatitis EXAM: CT ABDOMEN  AND PELVIS WITH CONTRAST TECHNIQUE: Multidetector CT imaging of the abdomen and pelvis was performed using the standard protocol following bolus administration of intravenous contrast. CONTRAST:  80 cc Isovue 300 intravenous. COMPARISON:  07/05/2016 FINDINGS: Lower chest: New small pleural effusions and atelectasis. New high-density material in the dependent right lower lobe. Hepatobiliary: Left-sided internalized biliary drain is in good position. Left-sided duct have been significantly decompressed. Right-sided ducts appear unchanged from prior. Hilar mass better demonstrated on recent CT and MRI. Pancreas: New peripancreatic edema and expansion without organized collection or necrosis. Spleen: Negative Adrenals/Urinary Tract: Negative adrenals. Left renal cortical cysts. No hydronephrosis. Urinary bladder is decompressed by Foley catheter which is in unremarkable position. Stomach/Bowel:  No obstruction. Appendectomy. Vascular/Lymphatic: No acute vascular abnormality. No mass or adenopathy. Reproductive:No pathologic findings. Other: Small non loculated ascites, considered reactive. Musculoskeletal: No acute or aggressive finding. Sclerotic focus in the T11 vertebral body is stable from 2007 . IMPRESSION: 1. Interval acute pancreatitis without complicating feature. 2. Interval left-sided internalized biliary drain with decompressed left lobe ducts. Right lobe ducts are unchanged and presumably  remain obstructed by a hilar mass. 3. New small pleural effusions and atelectasis. New high-density material in the right lower lobe, presumably interval aspiration. Electronically Signed   By: Monte Fantasia M.D.   On: 07/10/2016 15:58   Dg Abd Portable 1v  Result Date: 07/10/2016 CLINICAL DATA:  Ileus EXAM: PORTABLE ABDOMEN - 1 VIEW COMPARISON:  Plain film of the abdomen dated 07/09/2016 and plain film of the abdomen dated 07/08/2016. FINDINGS: The gas-filled loops of small bowel appreciated within the lower abdomen on yesterday's exam are less prominent today. Fairly large amount of gas persists in the colon. Pigtail catheter is stable position in the right abdomen. Enteric tube is in the stomach. Mild degenerative change noted within the lumbar spine. No acute or suspicious osseous finding. Surgical clips in the right upper quadrant are compatible with previous cholecystectomy IMPRESSION: 1. The mildly distended gas-filled loops of small bowel appreciated within the lower abdomen on yesterday's exam are less prominent today. No convincing evidence of a small bowel ileus on today's exam. 2. Fairly large amount of colonic gas persists suggesting some degree of colonic ileus. Electronically Signed   By: Franki Cabot M.D.   On: 07/10/2016 10:51   Dg Abd Portable 1v  Result Date: 07/09/2016 CLINICAL DATA:  Abdominal distention and pain today. EXAM: PORTABLE ABDOMEN - 1 VIEW COMPARISON:  07/08/2016 and prior studies FINDINGS: Mildly distended small bowel loops within the lower abdomen are now noted. Gas and stool throughout the colon identified. A biliary drain is noted and unchanged. No suspicious calcifications are identified. IMPRESSION: Mildly distended nonspecific small bowel loops within the lower abdomen. This may represent developing small bowel obstruction or focal ileus. Electronically Signed   By: Margarette Canada M.D.   On: 07/09/2016 20:21   Scheduled Meds: .  HYDROmorphone (DILAUDID) injection   1 mg Intravenous Once  . Influenza vac split quadrivalent PF  0.5 mL Intramuscular Tomorrow-1000  . piperacillin-tazobactam (ZOSYN)  IV  3.375 g Intravenous Q8H   Continuous Infusions: . 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

## 2016-07-12 LAB — BODY FLUID CULTURE: Special Requests: NORMAL

## 2017-01-23 IMAGING — CT CT ABD-PELV W/ CM
2 of 5 series · 17 of 46 positions shown, 19 images · IV contrast (isovue)
Comparison: 07/05/2016

CLINICAL DATA: Acute pancreatitis

EXAM:
CT ABDOMEN AND PELVIS WITH CONTRAST
TECHNIQUE: Multidetector CT imaging of the abdomen and pelvis was performed
using the standard protocol following bolus administration of
intravenous contrast.
CONTRAST:  80 cc Isovue 300 intravenous.

[Series 2: rtn a/p with · axial · 0.84mm/px · z∈[-414,+16]mm · 14 of 98 slices shown, 16 images]
[im 6/98  soft-tissue]
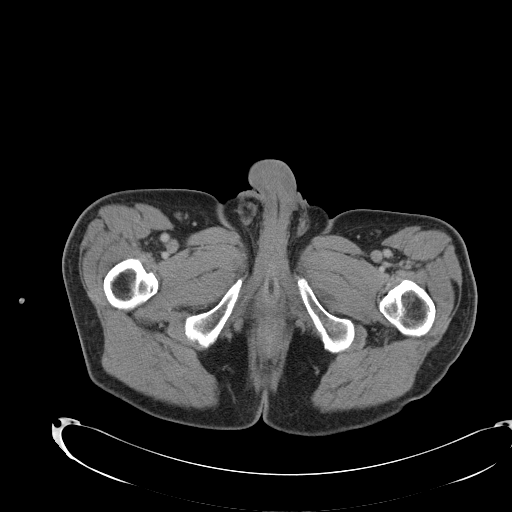
[im 6/98  bone]
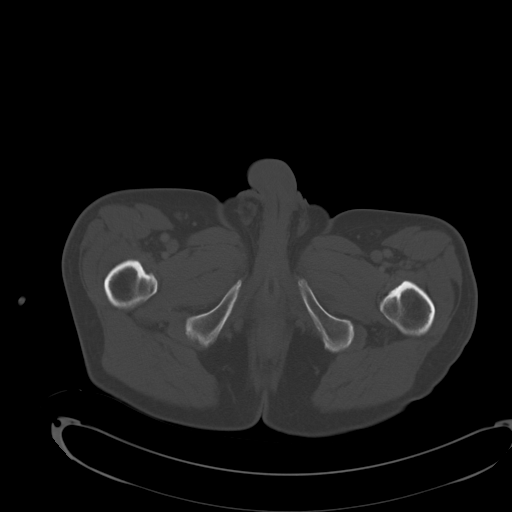
[im 12/98  soft-tissue]
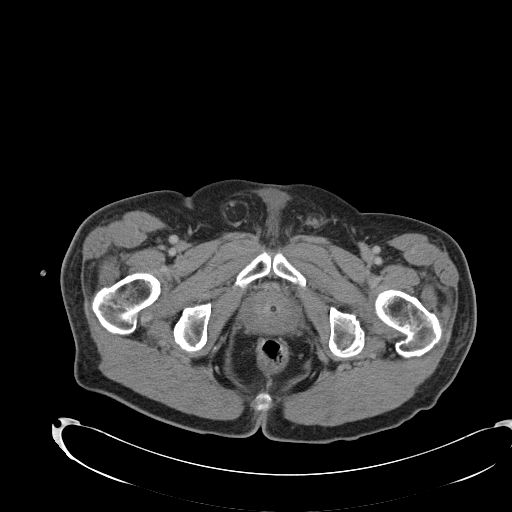
[im 18/98  soft-tissue]
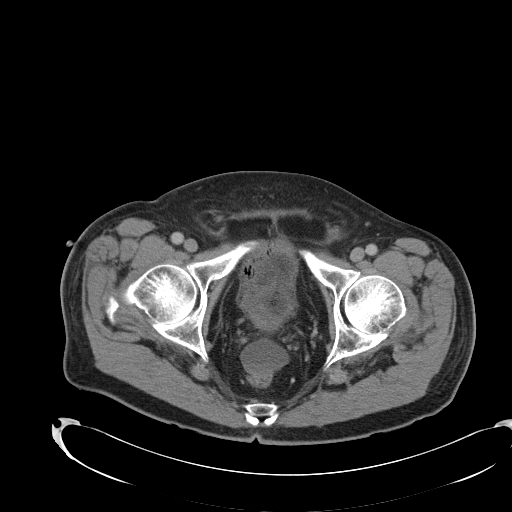
[im 29/98  soft-tissue]
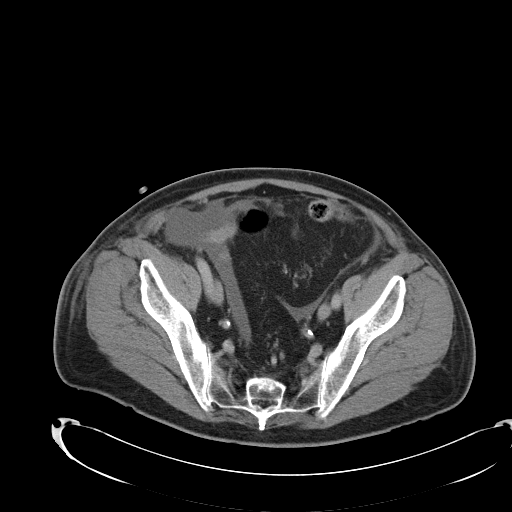
[im 35/98  soft-tissue]
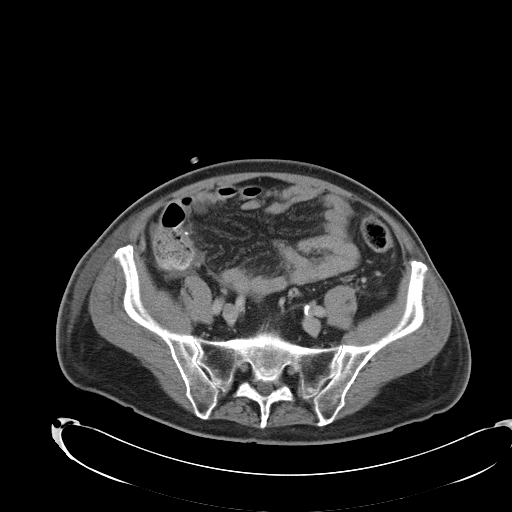
[im 40/98  soft-tissue]
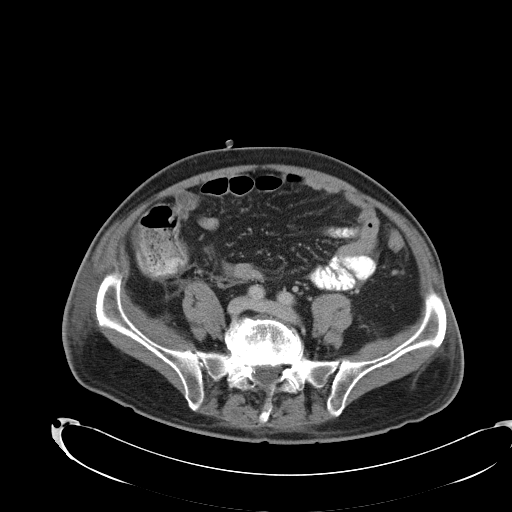
[im 46/98  soft-tissue]
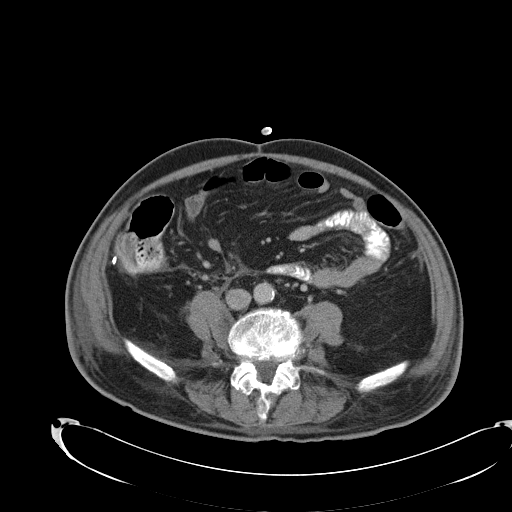
[im 52/98  soft-tissue]
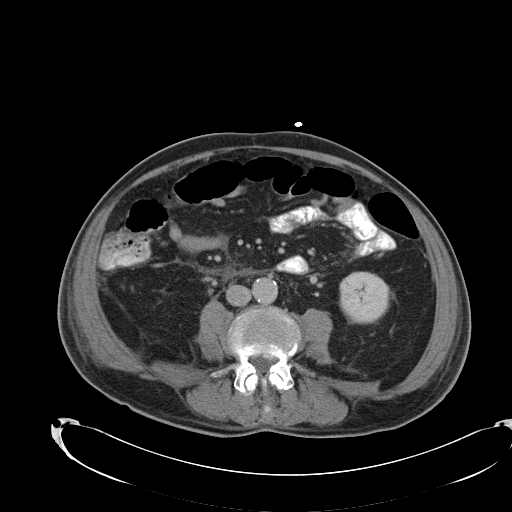
[im 58/98  soft-tissue]
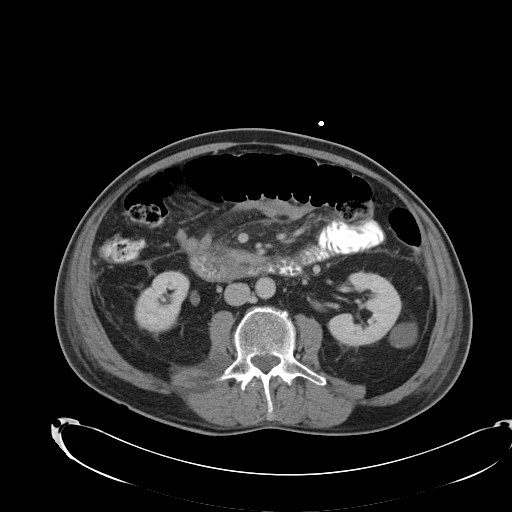
[im 58/98  bone]
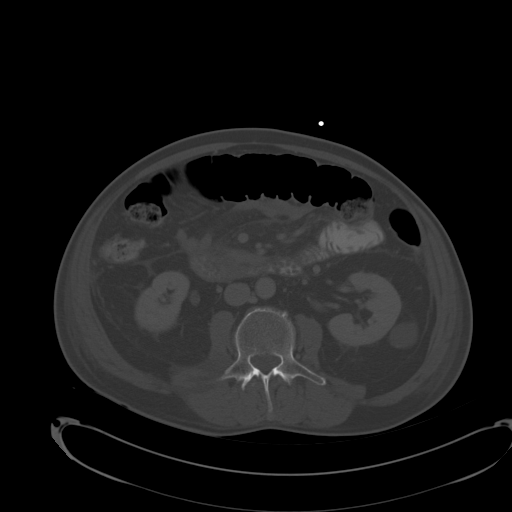
[im 63/98  soft-tissue]
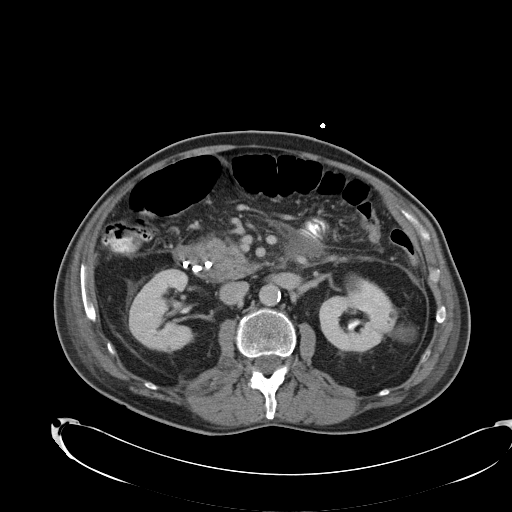
[im 75/98  soft-tissue]
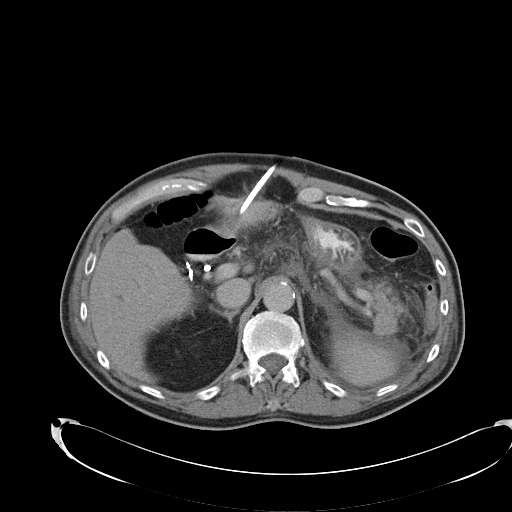
[im 80/98  soft-tissue]
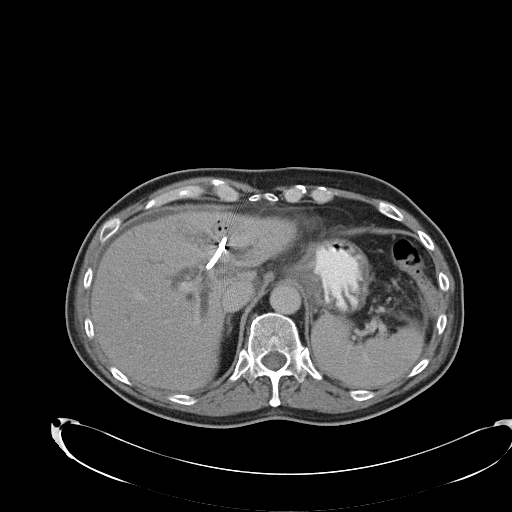
[im 86/98  soft-tissue]
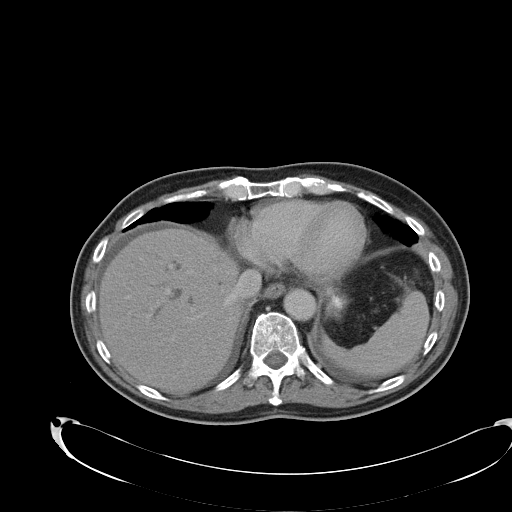
[im 92/98  soft-tissue]
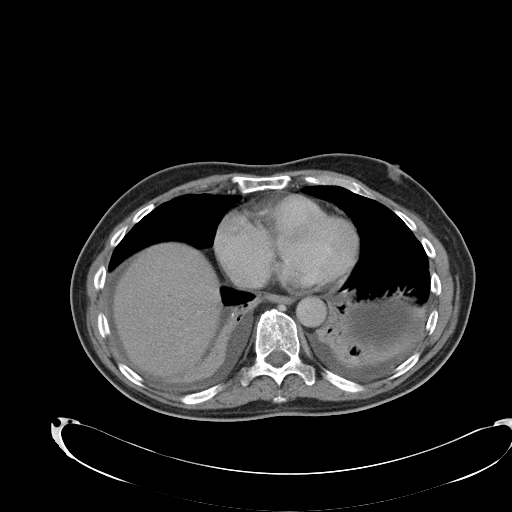

[Series 602: <mpr thick range> · coronal · 0.95mm/px · 3 of 133 slices shown]
[im 45/133  soft-tissue]
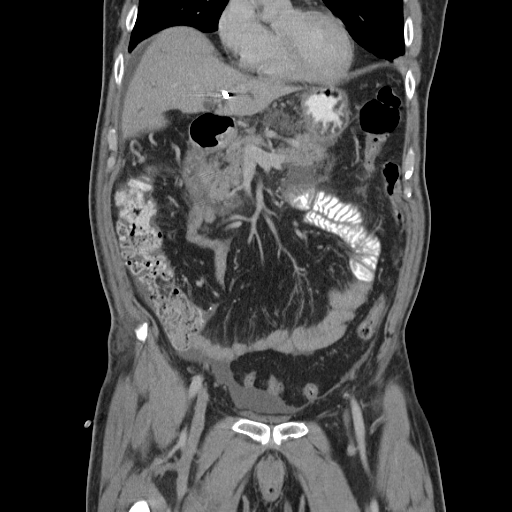
[im 59/133  soft-tissue]
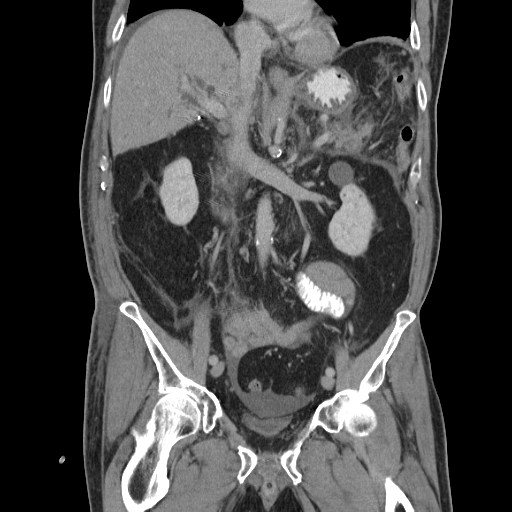
[im 74/133  soft-tissue]
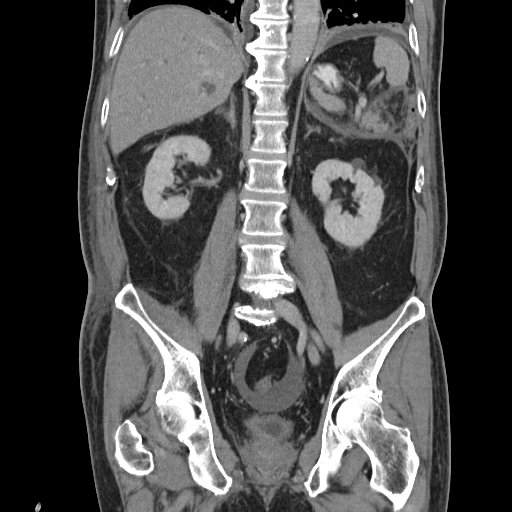

[17 of 46 positions shown; findings below may reference images not displayed]

FINDINGS: Lower chest: New small pleural effusions and atelectasis. New
high-density material in the dependent right lower lobe.

Hepatobiliary: Left-sided internalized biliary drain is in good
position. Left-sided duct have been significantly decompressed.
Right-sided ducts appear unchanged from prior. Hilar mass better
demonstrated on recent CT and MRI.

Pancreas: New peripancreatic edema and expansion without organized
collection or necrosis.

Spleen: Negative

Adrenals/Urinary Tract: Negative adrenals. Left renal cortical
cysts. No hydronephrosis. Urinary bladder is decompressed by Foley
catheter which is in unremarkable position.

Stomach/Bowel:  No obstruction. Appendectomy.

Vascular/Lymphatic: No acute vascular abnormality. No mass or
adenopathy.

Reproductive:No pathologic findings.

Other: Small non loculated ascites, considered reactive.

Musculoskeletal: No acute or aggressive finding. Sclerotic focus in
the T11 vertebral body is stable from 2443 .
IMPRESSION: 1. Interval acute pancreatitis without complicating feature.
2. Interval left-sided internalized biliary drain with decompressed
left lobe ducts. Right lobe ducts are unchanged and presumably
remain obstructed by a hilar mass.
3. New small pleural effusions and atelectasis. New high-density
material in the right lower lobe, presumably interval aspiration.

## 2019-10-03 DIAGNOSIS — B009 Herpesviral infection, unspecified: Secondary | ICD-10-CM | POA: Diagnosis not present

## 2019-10-09 DIAGNOSIS — Z6820 Body mass index (BMI) 20.0-20.9, adult: Secondary | ICD-10-CM | POA: Diagnosis not present

## 2019-10-09 DIAGNOSIS — E538 Deficiency of other specified B group vitamins: Secondary | ICD-10-CM | POA: Diagnosis not present

## 2019-10-09 DIAGNOSIS — B001 Herpesviral vesicular dermatitis: Secondary | ICD-10-CM | POA: Diagnosis not present

## 2019-12-04 DIAGNOSIS — E538 Deficiency of other specified B group vitamins: Secondary | ICD-10-CM | POA: Diagnosis not present

## 2020-01-06 DIAGNOSIS — E538 Deficiency of other specified B group vitamins: Secondary | ICD-10-CM | POA: Diagnosis not present

## 2020-02-10 DIAGNOSIS — E538 Deficiency of other specified B group vitamins: Secondary | ICD-10-CM | POA: Diagnosis not present

## 2020-03-12 DIAGNOSIS — E538 Deficiency of other specified B group vitamins: Secondary | ICD-10-CM | POA: Diagnosis not present

## 2020-04-13 DIAGNOSIS — E538 Deficiency of other specified B group vitamins: Secondary | ICD-10-CM | POA: Diagnosis not present

## 2020-06-08 DIAGNOSIS — E538 Deficiency of other specified B group vitamins: Secondary | ICD-10-CM | POA: Diagnosis not present

## 2020-07-08 DIAGNOSIS — Z23 Encounter for immunization: Secondary | ICD-10-CM | POA: Diagnosis not present

## 2020-07-08 DIAGNOSIS — E538 Deficiency of other specified B group vitamins: Secondary | ICD-10-CM | POA: Diagnosis not present

## 2020-08-10 DIAGNOSIS — E538 Deficiency of other specified B group vitamins: Secondary | ICD-10-CM | POA: Diagnosis not present

## 2020-09-09 DIAGNOSIS — E538 Deficiency of other specified B group vitamins: Secondary | ICD-10-CM | POA: Diagnosis not present

## 2020-10-07 DIAGNOSIS — J069 Acute upper respiratory infection, unspecified: Secondary | ICD-10-CM | POA: Diagnosis not present

## 2020-10-07 DIAGNOSIS — R059 Cough, unspecified: Secondary | ICD-10-CM | POA: Diagnosis not present

## 2020-10-07 DIAGNOSIS — Z20828 Contact with and (suspected) exposure to other viral communicable diseases: Secondary | ICD-10-CM | POA: Diagnosis not present

## 2020-10-13 DIAGNOSIS — Z20828 Contact with and (suspected) exposure to other viral communicable diseases: Secondary | ICD-10-CM | POA: Diagnosis not present

## 2020-10-13 DIAGNOSIS — J189 Pneumonia, unspecified organism: Secondary | ICD-10-CM | POA: Diagnosis not present

## 2020-10-20 DIAGNOSIS — E538 Deficiency of other specified B group vitamins: Secondary | ICD-10-CM | POA: Diagnosis not present

## 2020-11-20 DIAGNOSIS — E538 Deficiency of other specified B group vitamins: Secondary | ICD-10-CM | POA: Diagnosis not present

## 2020-12-22 DIAGNOSIS — Z0000 Encounter for general adult medical examination without abnormal findings: Secondary | ICD-10-CM | POA: Diagnosis not present

## 2020-12-22 DIAGNOSIS — E559 Vitamin D deficiency, unspecified: Secondary | ICD-10-CM | POA: Diagnosis not present

## 2020-12-22 DIAGNOSIS — Z9181 History of falling: Secondary | ICD-10-CM | POA: Diagnosis not present

## 2020-12-22 DIAGNOSIS — Z1331 Encounter for screening for depression: Secondary | ICD-10-CM | POA: Diagnosis not present

## 2020-12-22 DIAGNOSIS — E785 Hyperlipidemia, unspecified: Secondary | ICD-10-CM | POA: Diagnosis not present

## 2020-12-22 DIAGNOSIS — E538 Deficiency of other specified B group vitamins: Secondary | ICD-10-CM | POA: Diagnosis not present

## 2020-12-22 DIAGNOSIS — Z139 Encounter for screening, unspecified: Secondary | ICD-10-CM | POA: Diagnosis not present

## 2020-12-22 DIAGNOSIS — R7989 Other specified abnormal findings of blood chemistry: Secondary | ICD-10-CM | POA: Diagnosis not present

## 2021-01-25 DIAGNOSIS — E538 Deficiency of other specified B group vitamins: Secondary | ICD-10-CM | POA: Diagnosis not present

## 2021-03-03 DIAGNOSIS — E538 Deficiency of other specified B group vitamins: Secondary | ICD-10-CM | POA: Diagnosis not present

## 2021-04-05 DIAGNOSIS — E538 Deficiency of other specified B group vitamins: Secondary | ICD-10-CM | POA: Diagnosis not present

## 2021-04-05 DIAGNOSIS — Z681 Body mass index (BMI) 19 or less, adult: Secondary | ICD-10-CM | POA: Diagnosis not present

## 2021-04-05 DIAGNOSIS — Z125 Encounter for screening for malignant neoplasm of prostate: Secondary | ICD-10-CM | POA: Diagnosis not present

## 2021-04-05 DIAGNOSIS — K4090 Unilateral inguinal hernia, without obstruction or gangrene, not specified as recurrent: Secondary | ICD-10-CM | POA: Diagnosis not present

## 2021-04-05 DIAGNOSIS — R109 Unspecified abdominal pain: Secondary | ICD-10-CM | POA: Diagnosis not present

## 2021-04-13 DIAGNOSIS — C229 Malignant neoplasm of liver, not specified as primary or secondary: Secondary | ICD-10-CM | POA: Diagnosis not present

## 2021-04-13 DIAGNOSIS — M5136 Other intervertebral disc degeneration, lumbar region: Secondary | ICD-10-CM | POA: Diagnosis not present

## 2021-04-13 DIAGNOSIS — R109 Unspecified abdominal pain: Secondary | ICD-10-CM | POA: Diagnosis not present

## 2021-04-13 DIAGNOSIS — K4090 Unilateral inguinal hernia, without obstruction or gangrene, not specified as recurrent: Secondary | ICD-10-CM | POA: Diagnosis not present

## 2021-04-13 DIAGNOSIS — K7689 Other specified diseases of liver: Secondary | ICD-10-CM | POA: Diagnosis not present

## 2021-04-21 DIAGNOSIS — R1031 Right lower quadrant pain: Secondary | ICD-10-CM | POA: Diagnosis not present

## 2021-04-21 DIAGNOSIS — K4030 Unilateral inguinal hernia, with obstruction, without gangrene, not specified as recurrent: Secondary | ICD-10-CM | POA: Diagnosis not present

## 2021-05-05 DIAGNOSIS — Z681 Body mass index (BMI) 19 or less, adult: Secondary | ICD-10-CM | POA: Diagnosis not present

## 2021-05-05 DIAGNOSIS — D519 Vitamin B12 deficiency anemia, unspecified: Secondary | ICD-10-CM | POA: Diagnosis not present

## 2021-05-05 DIAGNOSIS — K4090 Unilateral inguinal hernia, without obstruction or gangrene, not specified as recurrent: Secondary | ICD-10-CM | POA: Diagnosis not present

## 2021-05-18 DIAGNOSIS — R1031 Right lower quadrant pain: Secondary | ICD-10-CM | POA: Diagnosis not present

## 2021-05-18 DIAGNOSIS — K4030 Unilateral inguinal hernia, with obstruction, without gangrene, not specified as recurrent: Secondary | ICD-10-CM | POA: Diagnosis not present

## 2021-05-18 DIAGNOSIS — R350 Frequency of micturition: Secondary | ICD-10-CM | POA: Diagnosis not present

## 2021-05-19 DIAGNOSIS — Z885 Allergy status to narcotic agent status: Secondary | ICD-10-CM | POA: Diagnosis not present

## 2021-05-19 DIAGNOSIS — R030 Elevated blood-pressure reading, without diagnosis of hypertension: Secondary | ICD-10-CM | POA: Diagnosis not present

## 2021-05-21 DIAGNOSIS — K4090 Unilateral inguinal hernia, without obstruction or gangrene, not specified as recurrent: Secondary | ICD-10-CM | POA: Diagnosis not present

## 2021-05-21 DIAGNOSIS — Z01810 Encounter for preprocedural cardiovascular examination: Secondary | ICD-10-CM | POA: Diagnosis not present

## 2021-05-24 DIAGNOSIS — Z8505 Personal history of malignant neoplasm of liver: Secondary | ICD-10-CM | POA: Diagnosis not present

## 2021-05-24 DIAGNOSIS — Z8616 Personal history of COVID-19: Secondary | ICD-10-CM | POA: Diagnosis not present

## 2021-05-24 DIAGNOSIS — K4090 Unilateral inguinal hernia, without obstruction or gangrene, not specified as recurrent: Secondary | ICD-10-CM | POA: Diagnosis not present

## 2021-05-24 DIAGNOSIS — I447 Left bundle-branch block, unspecified: Secondary | ICD-10-CM | POA: Diagnosis not present

## 2021-05-24 DIAGNOSIS — Z01810 Encounter for preprocedural cardiovascular examination: Secondary | ICD-10-CM | POA: Diagnosis not present

## 2021-05-24 DIAGNOSIS — R9431 Abnormal electrocardiogram [ECG] [EKG]: Secondary | ICD-10-CM | POA: Diagnosis not present

## 2021-05-27 DIAGNOSIS — E538 Deficiency of other specified B group vitamins: Secondary | ICD-10-CM | POA: Diagnosis not present

## 2021-07-01 DIAGNOSIS — E538 Deficiency of other specified B group vitamins: Secondary | ICD-10-CM | POA: Diagnosis not present

## 2021-08-03 DIAGNOSIS — Z23 Encounter for immunization: Secondary | ICD-10-CM | POA: Diagnosis not present

## 2021-08-03 DIAGNOSIS — E538 Deficiency of other specified B group vitamins: Secondary | ICD-10-CM | POA: Diagnosis not present

## 2021-09-03 DIAGNOSIS — E538 Deficiency of other specified B group vitamins: Secondary | ICD-10-CM | POA: Diagnosis not present

## 2021-09-22 DIAGNOSIS — N401 Enlarged prostate with lower urinary tract symptoms: Secondary | ICD-10-CM | POA: Diagnosis not present

## 2021-09-22 DIAGNOSIS — R3912 Poor urinary stream: Secondary | ICD-10-CM | POA: Diagnosis not present

## 2021-09-24 DIAGNOSIS — L299 Pruritus, unspecified: Secondary | ICD-10-CM | POA: Diagnosis not present

## 2021-09-24 DIAGNOSIS — L853 Xerosis cutis: Secondary | ICD-10-CM | POA: Diagnosis not present

## 2021-09-24 DIAGNOSIS — L219 Seborrheic dermatitis, unspecified: Secondary | ICD-10-CM | POA: Diagnosis not present

## 2021-10-04 DIAGNOSIS — Z6820 Body mass index (BMI) 20.0-20.9, adult: Secondary | ICD-10-CM | POA: Diagnosis not present

## 2021-10-04 DIAGNOSIS — B029 Zoster without complications: Secondary | ICD-10-CM | POA: Diagnosis not present

## 2021-10-04 DIAGNOSIS — E538 Deficiency of other specified B group vitamins: Secondary | ICD-10-CM | POA: Diagnosis not present

## 2021-10-06 DIAGNOSIS — B029 Zoster without complications: Secondary | ICD-10-CM | POA: Diagnosis not present

## 2021-10-06 DIAGNOSIS — R030 Elevated blood-pressure reading, without diagnosis of hypertension: Secondary | ICD-10-CM | POA: Diagnosis not present

## 2021-10-06 DIAGNOSIS — Z8505 Personal history of malignant neoplasm of liver: Secondary | ICD-10-CM | POA: Diagnosis not present

## 2021-10-06 DIAGNOSIS — Z7952 Long term (current) use of systemic steroids: Secondary | ICD-10-CM | POA: Diagnosis not present

## 2021-10-06 DIAGNOSIS — N400 Enlarged prostate without lower urinary tract symptoms: Secondary | ICD-10-CM | POA: Diagnosis not present

## 2021-11-04 DIAGNOSIS — E538 Deficiency of other specified B group vitamins: Secondary | ICD-10-CM | POA: Diagnosis not present

## 2021-12-02 DIAGNOSIS — N401 Enlarged prostate with lower urinary tract symptoms: Secondary | ICD-10-CM | POA: Diagnosis not present

## 2021-12-02 DIAGNOSIS — R3912 Poor urinary stream: Secondary | ICD-10-CM | POA: Diagnosis not present

## 2021-12-06 DIAGNOSIS — E538 Deficiency of other specified B group vitamins: Secondary | ICD-10-CM | POA: Diagnosis not present

## 2022-01-06 DIAGNOSIS — E538 Deficiency of other specified B group vitamins: Secondary | ICD-10-CM | POA: Diagnosis not present

## 2022-02-02 DIAGNOSIS — Z6820 Body mass index (BMI) 20.0-20.9, adult: Secondary | ICD-10-CM | POA: Diagnosis not present

## 2022-02-02 DIAGNOSIS — J0100 Acute maxillary sinusitis, unspecified: Secondary | ICD-10-CM | POA: Diagnosis not present

## 2022-02-07 DIAGNOSIS — E538 Deficiency of other specified B group vitamins: Secondary | ICD-10-CM | POA: Diagnosis not present

## 2022-03-10 DIAGNOSIS — E782 Mixed hyperlipidemia: Secondary | ICD-10-CM | POA: Diagnosis not present

## 2022-03-10 DIAGNOSIS — Z6820 Body mass index (BMI) 20.0-20.9, adult: Secondary | ICD-10-CM | POA: Diagnosis not present

## 2022-03-10 DIAGNOSIS — Z9181 History of falling: Secondary | ICD-10-CM | POA: Diagnosis not present

## 2022-03-10 DIAGNOSIS — Z139 Encounter for screening, unspecified: Secondary | ICD-10-CM | POA: Diagnosis not present

## 2022-03-10 DIAGNOSIS — Z0000 Encounter for general adult medical examination without abnormal findings: Secondary | ICD-10-CM | POA: Diagnosis not present

## 2022-03-10 DIAGNOSIS — E538 Deficiency of other specified B group vitamins: Secondary | ICD-10-CM | POA: Diagnosis not present

## 2022-03-10 DIAGNOSIS — Z1331 Encounter for screening for depression: Secondary | ICD-10-CM | POA: Diagnosis not present

## 2022-04-11 DIAGNOSIS — Z6820 Body mass index (BMI) 20.0-20.9, adult: Secondary | ICD-10-CM | POA: Diagnosis not present

## 2022-04-11 DIAGNOSIS — D696 Thrombocytopenia, unspecified: Secondary | ICD-10-CM | POA: Diagnosis not present

## 2022-04-11 DIAGNOSIS — H6992 Unspecified Eustachian tube disorder, left ear: Secondary | ICD-10-CM | POA: Diagnosis not present

## 2022-04-11 DIAGNOSIS — E538 Deficiency of other specified B group vitamins: Secondary | ICD-10-CM | POA: Diagnosis not present

## 2022-05-12 DIAGNOSIS — E538 Deficiency of other specified B group vitamins: Secondary | ICD-10-CM | POA: Diagnosis not present

## 2022-05-12 DIAGNOSIS — D696 Thrombocytopenia, unspecified: Secondary | ICD-10-CM | POA: Diagnosis not present

## 2022-05-23 ENCOUNTER — Other Ambulatory Visit: Payer: Self-pay | Admitting: Hematology and Oncology

## 2022-05-23 ENCOUNTER — Inpatient Hospital Stay: Payer: Medicare PPO | Attending: Hematology and Oncology | Admitting: Hematology and Oncology

## 2022-05-23 ENCOUNTER — Encounter: Payer: Self-pay | Admitting: Hematology and Oncology

## 2022-05-23 ENCOUNTER — Inpatient Hospital Stay: Payer: Medicare PPO

## 2022-05-23 VITALS — BP 151/80 | HR 58 | Temp 96.7°F | Resp 20 | Ht 68.0 in | Wt 133.3 lb

## 2022-05-23 DIAGNOSIS — Z8505 Personal history of malignant neoplasm of liver: Secondary | ICD-10-CM | POA: Insufficient documentation

## 2022-05-23 DIAGNOSIS — E538 Deficiency of other specified B group vitamins: Secondary | ICD-10-CM | POA: Insufficient documentation

## 2022-05-23 DIAGNOSIS — E559 Vitamin D deficiency, unspecified: Secondary | ICD-10-CM | POA: Diagnosis not present

## 2022-05-23 DIAGNOSIS — D649 Anemia, unspecified: Secondary | ICD-10-CM | POA: Diagnosis not present

## 2022-05-23 DIAGNOSIS — D696 Thrombocytopenia, unspecified: Secondary | ICD-10-CM

## 2022-05-23 DIAGNOSIS — K859 Acute pancreatitis without necrosis or infection, unspecified: Secondary | ICD-10-CM | POA: Diagnosis not present

## 2022-05-23 LAB — BASIC METABOLIC PANEL
BUN: 33 — AB (ref 4–21)
CO2: 30 — AB (ref 13–22)
Chloride: 102 (ref 99–108)
Creatinine: 1.2 (ref 0.6–1.3)
Glucose: 90
Potassium: 4.6 mEq/L (ref 3.5–5.1)
Sodium: 139 (ref 137–147)

## 2022-05-23 LAB — CBC AND DIFFERENTIAL
HCT: 41 (ref 41–53)
Hemoglobin: 13.4 — AB (ref 13.5–17.5)
Neutrophils Absolute: 5.11
Platelets: 115 10*3/uL — AB (ref 150–400)
WBC: 7

## 2022-05-23 LAB — CBC
MCV: 88 (ref 80–94)
RBC: 4.62 (ref 3.87–5.11)

## 2022-05-23 LAB — COMPREHENSIVE METABOLIC PANEL
Albumin: 4.6 (ref 3.5–5.0)
Calcium: 9.5 (ref 8.7–10.7)

## 2022-05-23 LAB — CMP (CANCER CENTER ONLY)
ALT: 15 U/L (ref 0–44)
AST: 22 U/L (ref 15–41)
Albumin: 4.4 g/dL (ref 3.5–5.0)
Alkaline Phosphatase: 61 U/L (ref 38–126)
Anion gap: 8 (ref 5–15)
BUN: 34 mg/dL — ABNORMAL HIGH (ref 8–23)
CO2: 28 mmol/L (ref 22–32)
Calcium: 9.6 mg/dL (ref 8.9–10.3)
Chloride: 105 mmol/L (ref 98–111)
Creatinine: 1.33 mg/dL — ABNORMAL HIGH (ref 0.61–1.24)
GFR, Estimated: 53 mL/min — ABNORMAL LOW (ref >60)
Glucose, Bld: 90 mg/dL (ref 70–99)
Potassium: 4.6 mmol/L (ref 3.5–5.1)
Sodium: 141 mmol/L (ref 135–145)
Total Bilirubin: 0.8 mg/dL (ref 0.3–1.2)
Total Protein: 7.4 g/dL (ref 6.5–8.1)

## 2022-05-23 LAB — HEPATIC FUNCTION PANEL
ALT: 19 U/L (ref 10–40)
AST: 30 (ref 14–40)
Alkaline Phosphatase: 70 (ref 25–125)
Bilirubin, Total: 0.7

## 2022-05-23 LAB — LACTATE DEHYDROGENASE: LDH: 161 U/L (ref 98–192)

## 2022-05-23 NOTE — Progress Notes (Unsigned)
[facility]  [address] [place],  [place]  [address] [phone]  Clinic Day:  05/23/2022  Referring physician: Ocie Doyne., MD   REASON FOR CONSULTATION:  Thrombocytopenia  HISTORY OF PRESENT ILLNESS:  Jeremiah Warren is a 81 y.o. male with mild thrombocytopenia, as well as mild anemia, since June, who is referred in consultation by Darrol Jump, PA-C for assessment and management.  On August 17 his white blood cell count was 6.2 with a normal differential, hemoglobin 13.2 with an MCV of 89 and platelets 102,000.  B12 was greater than 2000, folate was normal, iron studies were normal with a TIBC of 283, iron saturation 26% and ferritin 76.  In July his hemoglobin was 12.8 and platelets 117,000.  In June his hemoglobin was 13.6 and platelets 102,000.  The patient denies having known previous anemia or thrombocytopenia.  He reports mild fatigue.  He denies abnormal bleeding or bruising.  He denies any overt form of blood loss.  He reports intermittent acid reflux, for which he uses Tums as needed.  He reports history of chronic headaches, which are stable.  He denies any family history of hematologic malignancy.    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  He was treated with surgical resection alone at [facility].  Review of his records reveals he had stage II (T2a N0 M0) cholangiocarcinoma, treated with partial hepatectomy with a Roux-en-Y hepaticojejunostomy.  Due to a lengthy post-operative course, complicated by multiple intra-abdominal abscesses, ascites and failure to thrive, he did not receive adjuvant therapy.  He has not been seen by the surgical oncologist since 2020, although follow-up in 6 months for clinical evaluation and CA 19-9 was recommended.  Annual CT imaging was recommended until he is 5 years out from his diagnosis.  He had a CT abdomen and pelvis in July 2022,  which revealed partial left hepatectomy without evidence of recurrent disease.  He had a right inguinal hernia, which he states he had repaired last year in [place].  Review of his records reveals he had anemia and thrombocytopenia with a hemoglobin of 13.1 and platelets 83,000 in August 2022 at the time of his hernia repair.  In addition to above, he has B12 deficiency for which he is on B12 injections monthly.  He has vitamin D deficiency, for which he is on high-dose vitamin D weekly.  He has a history of eczema.  He has a history of pneumonia.  Hyperlipidemia is mentioned in his history, but he states his cholesterol was normal at last check and he is not on any medications.  He states he is up-to-date on colonoscopy and further colonoscopy has not been recommended.  He states he had an EGD about 20 years ago, which was also apparently negative.  Review of his records reveals he had a colonoscopy with Dr. Lyda Jester in 2013 with removal of a hyperplastic polyp from the colon and rectum.  He is status post appendectomy and cholecystectomy.  He denies any family history of malignancy.  REVIEW OF SYSTEMS:  Review of Systems  Constitutional:  Positive for fatigue (Mild). Negative for appetite change, chills, fever and unexpected weight change.  HENT:   Negative for lump/mass, mouth sores and sore throat.   Respiratory:  Negative for cough and shortness of breath.   Cardiovascular:  Negative for chest pain and leg swelling.  Gastrointestinal:  Negative for abdominal distention, abdominal  pain, blood in stool, constipation, diarrhea, nausea, rectal pain and vomiting.  Genitourinary:  Negative for difficulty urinating, dysuria, frequency and hematuria.   Musculoskeletal:  Negative for arthralgias, back pain and myalgias.  Skin:  Negative for itching, rash and wound.  Neurological:  Positive for headaches (Chronic, stable). Negative for dizziness, extremity weakness, light-headedness and numbness.   Hematological:  Negative for adenopathy. Does not bruise/bleed easily.  Psychiatric/Behavioral:  Negative for depression and sleep disturbance. The patient is not nervous/anxious.      VITALS:  Blood pressure (!) 151/80, pulse (!) 58, temperature (!) 96.7 F (35.9 C), temperature source Tympanic, resp. rate 20, height '5\' 8"'$  (1.727 m), weight 133 lb 4.8 oz (60.5 kg), SpO2 99 %.  Wt Readings from Last 3 Encounters:  05/23/22 133 lb 4.8 oz (60.5 kg)  07/06/16 145 lb 11.6 oz (66.1 kg)  07/06/16 145 lb (65.8 kg)    Body mass index is 20.27 kg/m.  Performance status (ECOG): 0 - Asymptomatic  PHYSICAL EXAM:  Physical Exam Vitals and nursing note reviewed.  Constitutional:      General: He is not in acute distress.    Appearance: Normal appearance. He is normal weight.  HENT:     Head: Normocephalic and atraumatic.     Mouth/Throat:     Mouth: Mucous membranes are moist.     Pharynx: Oropharynx is clear. No oropharyngeal exudate or posterior oropharyngeal erythema.  Eyes:     General: No scleral icterus.    Extraocular Movements: Extraocular movements intact.     Conjunctiva/sclera: Conjunctivae normal.     Pupils: Pupils are equal, round, and reactive to light.  Cardiovascular:     Rate and Rhythm: Normal rate and regular rhythm.     Heart sounds: Normal heart sounds. No murmur heard.    No friction rub. No gallop.  Pulmonary:     Effort: Pulmonary effort is normal.     Breath sounds: Examination of the right-lower field reveals rales. Examination of the left-lower field reveals decreased breath sounds. Decreased breath sounds and rales present. No wheezing or rhonchi.  Abdominal:     General: Bowel sounds are normal. There is no distension.     Palpations: Abdomen is soft. There is no hepatomegaly, splenomegaly or mass.     Tenderness: There is no abdominal tenderness.  Musculoskeletal:        General: Normal range of motion.     Cervical back: Normal range of motion and  neck supple. No tenderness.     Right lower leg: No edema.     Left lower leg: No edema.  Lymphadenopathy:     Cervical: No cervical adenopathy.     Upper Body:     Right upper body: No supraclavicular or axillary adenopathy.     Left upper body: No supraclavicular or axillary adenopathy.     Lower Body: No right inguinal adenopathy. No left inguinal adenopathy.  Skin:    General: Skin is warm and dry.     Coloration: Skin is not jaundiced.     Findings: No rash.  Neurological:     Mental Status: He is alert and oriented to person, place, and time.     Cranial Nerves: No cranial nerve deficit.  Psychiatric:        Mood and Affect: Mood normal.        Behavior: Behavior normal.        Thought Content: Thought content normal.      LABS:  Latest Ref Rng & Units 05/23/2022   12:00 AM 07/11/2016    4:48 AM 07/10/2016    4:58 AM  CBC  WBC  7.0     13.4  17.9   Hemoglobin 13.5 - 17.5 13.4     11.6  13.2   Hematocrit 41 - 53 41     33.8  36.8   Platelets 150 - 400 K/uL 115     188  184      This result is from an external source.      Latest Ref Rng & Units 05/23/2022   11:08 AM 05/23/2022   12:00 AM 07/11/2016    4:48 AM  CMP  Glucose 70 - 99 mg/dL 90   120   BUN 8 - 23 mg/dL 34  33     17   Creatinine 0.61 - 1.24 mg/dL 1.33  1.2     0.49   Sodium 135 - 145 mmol/L 141  139     142   Potassium 3.5 - 5.1 mmol/L 4.6  4.6     3.7   Chloride 98 - 111 mmol/L 105  102     111   CO2 22 - 32 mmol/L '28  30     27   '$ Calcium 8.9 - 10.3 mg/dL 9.6  9.5     8.1   Total Protein 6.5 - 8.1 g/dL 7.4   4.8   Total Bilirubin 0.3 - 1.2 mg/dL 0.8   19.1   Alkaline Phos 38 - 126 U/L 61  70     214   AST 15 - 41 U/L 22  30     105   ALT 0 - 44 U/L 15  19     108      This result is from an external source.     Lab Results  Component Value Date   CEA 4.1 07/07/2016   /  CEA  Date Value Ref Range Status  07/07/2016 4.1 0.0 - 4.7 ng/mL Final    Comment:    (NOTE)       Roche  ECLIA methodology       Nonsmokers  <3.9                                     Smokers     <5.6 Performed At: [facility] [place], [place] [id number] Lindon Romp MD KC:[id number]    No results found for: "PSA1" No results found for: "CAN199" No results found for: "CAN125"  No results found for: "TOTALPROTELP", "ALBUMINELP", "A1GS", "A2GS", "BETS", "BETA2SER", "GAMS", "MSPIKE", "SPEI" No results found for: "TIBC", "FERRITIN", "IRONPCTSAT" Lab Results  Component Value Date   LDH 161 05/23/2022    STUDIES:  No results found.    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 He has never used smokeless tobacco. He reports current alcohol use. He reports that he does not use drugs.The patient is alone today.  Allergies:  Allergies  Allergen Reactions   Levaquin [Levofloxacin]    Percocet [Oxycodone-Acetaminophen] Nausea Only and Anxiety    Current Medications: Current Outpatient Medications  Medication Sig Dispense Refill   Ascorbic Acid (VITAMIN C) 1000 MG tablet Take 1,000 mg by mouth daily.     Cyanocobalamin (B-12 COMPLIANCE INJECTION IJ) Inject as directed. monthly     ibuprofen (ADVIL) 600 MG tablet Take 600 mg by mouth every 8 (eight) hours as needed.     Multiple Vitamin (MULTIVITAMIN) tablet Take 1 tablet by mouth daily.     Lactobacillus Rhamnosus, GG, (CULTURELLE) CAPS Take 1 capsule by mouth daily.     No  current facility-administered medications for this visit.     ASSESSMENT & PLAN:   Assessment/Plan:  Jeremiah Warren is a 81 y.o. male mild anemia and thrombocytopenia, which has been present for at least a year.  His white blood count is normal with a normal differential.  He has a known B12 deficiency, for which he is on vitamin B-12 injections monthly.  His B12 level was elevated 2 weeks ago.  There was no evidence of nutritional deficiency contributing to cytopenias.  As he does not have iron deficiency, I do not feel he needs to see Dr. Lyda Jester.  The cytopenias may represent an early myelodysplasia, but as they are mild, I recommend observation.  I will plan to see him back in 3 months for repeat clinical assessment.   I discussed the assessment and plan with the patient.  He was provided an opportunity to ask questions and all were answered.  The patient agreed with the plan and demonstrated an understanding of the instructions.  The patient was advised to call back if he develops symptoms worsening anemia or thrombocytopenia.  Thank you for the referral.   I provided 30 minutes of face-to-face time during this encounter and > 50% was spent counseling as documented under my assessment and plan.    Marvia Pickles, PA-C

## 2022-06-02 ENCOUNTER — Other Ambulatory Visit: Payer: Self-pay | Admitting: Hematology and Oncology

## 2022-06-02 DIAGNOSIS — D649 Anemia, unspecified: Secondary | ICD-10-CM

## 2022-06-02 DIAGNOSIS — D696 Thrombocytopenia, unspecified: Secondary | ICD-10-CM

## 2022-06-13 DIAGNOSIS — E538 Deficiency of other specified B group vitamins: Secondary | ICD-10-CM | POA: Diagnosis not present

## 2022-07-14 DIAGNOSIS — E538 Deficiency of other specified B group vitamins: Secondary | ICD-10-CM | POA: Diagnosis not present

## 2022-07-14 DIAGNOSIS — Z23 Encounter for immunization: Secondary | ICD-10-CM | POA: Diagnosis not present

## 2022-08-15 DIAGNOSIS — E538 Deficiency of other specified B group vitamins: Secondary | ICD-10-CM | POA: Diagnosis not present

## 2022-08-23 ENCOUNTER — Ambulatory Visit: Payer: Medicare PPO | Admitting: Hematology and Oncology

## 2022-08-23 ENCOUNTER — Other Ambulatory Visit: Payer: Medicare PPO

## 2022-09-05 DIAGNOSIS — L821 Other seborrheic keratosis: Secondary | ICD-10-CM | POA: Diagnosis not present

## 2022-09-05 DIAGNOSIS — D485 Neoplasm of uncertain behavior of skin: Secondary | ICD-10-CM | POA: Diagnosis not present

## 2022-09-15 DIAGNOSIS — E538 Deficiency of other specified B group vitamins: Secondary | ICD-10-CM | POA: Diagnosis not present

## 2022-10-17 DIAGNOSIS — E538 Deficiency of other specified B group vitamins: Secondary | ICD-10-CM | POA: Diagnosis not present

## 2022-10-26 DIAGNOSIS — B351 Tinea unguium: Secondary | ICD-10-CM | POA: Diagnosis not present

## 2022-10-26 DIAGNOSIS — L608 Other nail disorders: Secondary | ICD-10-CM | POA: Diagnosis not present

## 2022-10-26 DIAGNOSIS — M79676 Pain in unspecified toe(s): Secondary | ICD-10-CM | POA: Diagnosis not present

## 2022-11-02 ENCOUNTER — Ambulatory Visit: Payer: Medicare PPO | Admitting: Podiatry

## 2022-11-02 ENCOUNTER — Encounter: Payer: Self-pay | Admitting: Podiatry

## 2022-11-02 DIAGNOSIS — B351 Tinea unguium: Secondary | ICD-10-CM | POA: Diagnosis not present

## 2022-11-02 DIAGNOSIS — M79674 Pain in right toe(s): Secondary | ICD-10-CM

## 2022-11-02 DIAGNOSIS — M79675 Pain in left toe(s): Secondary | ICD-10-CM

## 2022-11-02 NOTE — Progress Notes (Signed)
  Subjective:  Patient ID: Jeremiah Warren, male    DOB: 09-Apr-1941,   MRN: [id number]  Chief Complaint  Patient presents with   Nail Problem    Bil nail trim, nail deformity. Pain to bilateral hallux.     82 y.o. male presents for concern of thickened elongated and painful nails that are difficult to trim. Requesting to have them trimmed today.  Patient is not diabetic and not on any blood thinners.   PCP:  Darrol Jump, PA-C    . Denies any other pedal complaints. Denies n/v/f/c.   Past Medical History:  Diagnosis Date   Eczema    History of bile duct cancer    Hyperlipidemia    Low platelet count (HCC)    Vitamin B 12 deficiency    Vitamin D deficiency     Objective:  Physical Exam: Vascular: DP/PT pulses 2/4 bilateral. CFT <3 seconds. Normal hair growth on digits. No edema.  Skin. No lacerations or abrasions bilateral feet. Nails 1-5 bilateral are thickened and elongated with subungual debris.  Musculoskeletal: MMT 5/5 bilateral lower extremities in DF, PF, Inversion and Eversion. Deceased ROM in DF of ankle joint.  Neurological: Sensation intact to light touch.   Assessment:   1. Pain due to onychomycosis of toenails of both feet      Plan:  Patient was evaluated and treated and all questions answered. -Discussed and educated patient on  foot care, especially with  regards to the vascular, neurological and musculoskeletal systems.  -Discussed supportive shoes at all times and checking feet regularly.  -Mechanically debrided all nails 1-5 bilateral using sterile nail nipper and filed with dremel without incident as courtesy -Answered all patient questions -Patient to return as needed -Patient advised to call the office if any problems or questions arise in the meantime.   Lorenda Peck, DPM

## 2022-11-08 DIAGNOSIS — E559 Vitamin D deficiency, unspecified: Secondary | ICD-10-CM | POA: Diagnosis not present

## 2022-11-08 DIAGNOSIS — E782 Mixed hyperlipidemia: Secondary | ICD-10-CM | POA: Diagnosis not present

## 2022-11-08 DIAGNOSIS — D696 Thrombocytopenia, unspecified: Secondary | ICD-10-CM | POA: Diagnosis not present

## 2022-11-08 DIAGNOSIS — N401 Enlarged prostate with lower urinary tract symptoms: Secondary | ICD-10-CM | POA: Diagnosis not present

## 2022-11-08 DIAGNOSIS — E538 Deficiency of other specified B group vitamins: Secondary | ICD-10-CM | POA: Diagnosis not present

## 2022-11-08 DIAGNOSIS — Z6821 Body mass index (BMI) 21.0-21.9, adult: Secondary | ICD-10-CM | POA: Diagnosis not present

## 2022-11-17 DIAGNOSIS — E538 Deficiency of other specified B group vitamins: Secondary | ICD-10-CM | POA: Diagnosis not present

## 2022-12-19 DIAGNOSIS — R7989 Other specified abnormal findings of blood chemistry: Secondary | ICD-10-CM | POA: Diagnosis not present

## 2022-12-19 DIAGNOSIS — N138 Other obstructive and reflux uropathy: Secondary | ICD-10-CM | POA: Diagnosis not present

## 2022-12-19 DIAGNOSIS — E538 Deficiency of other specified B group vitamins: Secondary | ICD-10-CM | POA: Diagnosis not present

## 2022-12-19 DIAGNOSIS — N401 Enlarged prostate with lower urinary tract symptoms: Secondary | ICD-10-CM | POA: Diagnosis not present

## 2023-01-24 DIAGNOSIS — E538 Deficiency of other specified B group vitamins: Secondary | ICD-10-CM | POA: Diagnosis not present

## 2023-01-30 DIAGNOSIS — N138 Other obstructive and reflux uropathy: Secondary | ICD-10-CM | POA: Diagnosis not present

## 2023-01-30 DIAGNOSIS — N401 Enlarged prostate with lower urinary tract symptoms: Secondary | ICD-10-CM | POA: Diagnosis not present

## 2023-02-27 DIAGNOSIS — E538 Deficiency of other specified B group vitamins: Secondary | ICD-10-CM | POA: Diagnosis not present

## 2023-03-14 DIAGNOSIS — E559 Vitamin D deficiency, unspecified: Secondary | ICD-10-CM | POA: Diagnosis not present

## 2023-03-14 DIAGNOSIS — Z139 Encounter for screening, unspecified: Secondary | ICD-10-CM | POA: Diagnosis not present

## 2023-03-14 DIAGNOSIS — Z1331 Encounter for screening for depression: Secondary | ICD-10-CM | POA: Diagnosis not present

## 2023-03-14 DIAGNOSIS — Z125 Encounter for screening for malignant neoplasm of prostate: Secondary | ICD-10-CM | POA: Diagnosis not present

## 2023-03-14 DIAGNOSIS — E538 Deficiency of other specified B group vitamins: Secondary | ICD-10-CM | POA: Diagnosis not present

## 2023-03-14 DIAGNOSIS — Z6821 Body mass index (BMI) 21.0-21.9, adult: Secondary | ICD-10-CM | POA: Diagnosis not present

## 2023-03-14 DIAGNOSIS — Z9181 History of falling: Secondary | ICD-10-CM | POA: Diagnosis not present

## 2023-03-14 DIAGNOSIS — E782 Mixed hyperlipidemia: Secondary | ICD-10-CM | POA: Diagnosis not present

## 2023-03-17 DIAGNOSIS — Z833 Family history of diabetes mellitus: Secondary | ICD-10-CM | POA: Diagnosis not present

## 2023-03-17 DIAGNOSIS — N401 Enlarged prostate with lower urinary tract symptoms: Secondary | ICD-10-CM | POA: Diagnosis not present

## 2023-03-17 DIAGNOSIS — Z8505 Personal history of malignant neoplasm of liver: Secondary | ICD-10-CM | POA: Diagnosis not present

## 2023-03-17 DIAGNOSIS — E559 Vitamin D deficiency, unspecified: Secondary | ICD-10-CM | POA: Diagnosis not present

## 2023-03-17 DIAGNOSIS — E039 Hypothyroidism, unspecified: Secondary | ICD-10-CM | POA: Diagnosis not present

## 2023-03-17 DIAGNOSIS — R32 Unspecified urinary incontinence: Secondary | ICD-10-CM | POA: Diagnosis not present

## 2023-03-17 DIAGNOSIS — N1831 Chronic kidney disease, stage 3a: Secondary | ICD-10-CM | POA: Diagnosis not present

## 2023-03-17 DIAGNOSIS — M1990 Unspecified osteoarthritis, unspecified site: Secondary | ICD-10-CM | POA: Diagnosis not present

## 2023-03-17 DIAGNOSIS — Z8249 Family history of ischemic heart disease and other diseases of the circulatory system: Secondary | ICD-10-CM | POA: Diagnosis not present

## 2023-04-03 DIAGNOSIS — E538 Deficiency of other specified B group vitamins: Secondary | ICD-10-CM | POA: Diagnosis not present

## 2023-04-12 DIAGNOSIS — D649 Anemia, unspecified: Secondary | ICD-10-CM | POA: Diagnosis not present

## 2023-05-04 DIAGNOSIS — E538 Deficiency of other specified B group vitamins: Secondary | ICD-10-CM | POA: Diagnosis not present

## 2023-05-15 DIAGNOSIS — D649 Anemia, unspecified: Secondary | ICD-10-CM | POA: Diagnosis not present

## 2023-06-05 DIAGNOSIS — E538 Deficiency of other specified B group vitamins: Secondary | ICD-10-CM | POA: Diagnosis not present

## 2023-07-06 DIAGNOSIS — E538 Deficiency of other specified B group vitamins: Secondary | ICD-10-CM | POA: Diagnosis not present

## 2023-07-25 DIAGNOSIS — Z6821 Body mass index (BMI) 21.0-21.9, adult: Secondary | ICD-10-CM | POA: Diagnosis not present

## 2023-07-25 DIAGNOSIS — J209 Acute bronchitis, unspecified: Secondary | ICD-10-CM | POA: Diagnosis not present

## 2023-08-02 DIAGNOSIS — N138 Other obstructive and reflux uropathy: Secondary | ICD-10-CM | POA: Diagnosis not present

## 2023-08-02 DIAGNOSIS — N401 Enlarged prostate with lower urinary tract symptoms: Secondary | ICD-10-CM | POA: Diagnosis not present

## 2023-08-07 DIAGNOSIS — E538 Deficiency of other specified B group vitamins: Secondary | ICD-10-CM | POA: Diagnosis not present

## 2023-08-07 DIAGNOSIS — Z23 Encounter for immunization: Secondary | ICD-10-CM | POA: Diagnosis not present

## 2023-08-22 DIAGNOSIS — H26491 Other secondary cataract, right eye: Secondary | ICD-10-CM | POA: Diagnosis not present

## 2023-09-06 DIAGNOSIS — H26491 Other secondary cataract, right eye: Secondary | ICD-10-CM | POA: Diagnosis not present

## 2023-09-13 DIAGNOSIS — N400 Enlarged prostate without lower urinary tract symptoms: Secondary | ICD-10-CM | POA: Diagnosis not present

## 2023-09-13 DIAGNOSIS — E782 Mixed hyperlipidemia: Secondary | ICD-10-CM | POA: Diagnosis not present

## 2023-09-13 DIAGNOSIS — E538 Deficiency of other specified B group vitamins: Secondary | ICD-10-CM | POA: Diagnosis not present

## 2023-09-13 DIAGNOSIS — K219 Gastro-esophageal reflux disease without esophagitis: Secondary | ICD-10-CM | POA: Diagnosis not present

## 2023-09-13 DIAGNOSIS — E559 Vitamin D deficiency, unspecified: Secondary | ICD-10-CM | POA: Diagnosis not present

## 2023-10-16 DIAGNOSIS — E538 Deficiency of other specified B group vitamins: Secondary | ICD-10-CM | POA: Diagnosis not present

## 2023-11-27 DIAGNOSIS — E538 Deficiency of other specified B group vitamins: Secondary | ICD-10-CM | POA: Diagnosis not present

## 2023-12-28 DIAGNOSIS — E538 Deficiency of other specified B group vitamins: Secondary | ICD-10-CM | POA: Diagnosis not present

## 2024-01-10 DIAGNOSIS — R5383 Other fatigue: Secondary | ICD-10-CM | POA: Diagnosis not present

## 2024-01-10 DIAGNOSIS — J302 Other seasonal allergic rhinitis: Secondary | ICD-10-CM | POA: Diagnosis not present

## 2024-01-10 DIAGNOSIS — R5381 Other malaise: Secondary | ICD-10-CM | POA: Diagnosis not present

## 2024-01-10 DIAGNOSIS — Z79899 Other long term (current) drug therapy: Secondary | ICD-10-CM | POA: Diagnosis not present

## 2024-01-10 DIAGNOSIS — R9431 Abnormal electrocardiogram [ECG] [EKG]: Secondary | ICD-10-CM | POA: Diagnosis not present

## 2024-01-10 DIAGNOSIS — R079 Chest pain, unspecified: Secondary | ICD-10-CM | POA: Diagnosis not present

## 2024-01-10 DIAGNOSIS — R0981 Nasal congestion: Secondary | ICD-10-CM | POA: Diagnosis not present

## 2024-01-10 DIAGNOSIS — R509 Fever, unspecified: Secondary | ICD-10-CM | POA: Diagnosis not present

## 2024-01-10 DIAGNOSIS — R051 Acute cough: Secondary | ICD-10-CM | POA: Diagnosis not present

## 2024-01-10 DIAGNOSIS — R0602 Shortness of breath: Secondary | ICD-10-CM | POA: Diagnosis not present

## 2024-01-10 DIAGNOSIS — R0789 Other chest pain: Secondary | ICD-10-CM | POA: Diagnosis not present

## 2024-01-10 DIAGNOSIS — J40 Bronchitis, not specified as acute or chronic: Secondary | ICD-10-CM | POA: Diagnosis not present

## 2024-01-30 DIAGNOSIS — E538 Deficiency of other specified B group vitamins: Secondary | ICD-10-CM | POA: Diagnosis not present

## 2024-02-08 DIAGNOSIS — N401 Enlarged prostate with lower urinary tract symptoms: Secondary | ICD-10-CM | POA: Diagnosis not present

## 2024-02-08 DIAGNOSIS — R351 Nocturia: Secondary | ICD-10-CM | POA: Diagnosis not present

## 2024-02-08 DIAGNOSIS — R3912 Poor urinary stream: Secondary | ICD-10-CM | POA: Diagnosis not present

## 2024-02-21 DIAGNOSIS — L820 Inflamed seborrheic keratosis: Secondary | ICD-10-CM | POA: Diagnosis not present

## 2024-02-21 DIAGNOSIS — L814 Other melanin hyperpigmentation: Secondary | ICD-10-CM | POA: Diagnosis not present

## 2024-02-21 DIAGNOSIS — L821 Other seborrheic keratosis: Secondary | ICD-10-CM | POA: Diagnosis not present

## 2024-03-04 DIAGNOSIS — E538 Deficiency of other specified B group vitamins: Secondary | ICD-10-CM | POA: Diagnosis not present

## 2024-03-13 DIAGNOSIS — D696 Thrombocytopenia, unspecified: Secondary | ICD-10-CM | POA: Diagnosis not present

## 2024-03-13 DIAGNOSIS — E782 Mixed hyperlipidemia: Secondary | ICD-10-CM | POA: Diagnosis not present

## 2024-03-13 DIAGNOSIS — Z125 Encounter for screening for malignant neoplasm of prostate: Secondary | ICD-10-CM | POA: Diagnosis not present

## 2024-03-13 DIAGNOSIS — N400 Enlarged prostate without lower urinary tract symptoms: Secondary | ICD-10-CM | POA: Diagnosis not present

## 2024-03-13 DIAGNOSIS — K219 Gastro-esophageal reflux disease without esophagitis: Secondary | ICD-10-CM | POA: Diagnosis not present

## 2024-03-13 DIAGNOSIS — E034 Atrophy of thyroid (acquired): Secondary | ICD-10-CM | POA: Diagnosis not present

## 2024-03-13 DIAGNOSIS — E559 Vitamin D deficiency, unspecified: Secondary | ICD-10-CM | POA: Diagnosis not present

## 2024-03-13 DIAGNOSIS — E538 Deficiency of other specified B group vitamins: Secondary | ICD-10-CM | POA: Diagnosis not present

## 2024-04-04 DIAGNOSIS — E538 Deficiency of other specified B group vitamins: Secondary | ICD-10-CM | POA: Diagnosis not present

## 2024-04-09 DIAGNOSIS — R109 Unspecified abdominal pain: Secondary | ICD-10-CM | POA: Diagnosis not present

## 2024-04-09 DIAGNOSIS — Z6821 Body mass index (BMI) 21.0-21.9, adult: Secondary | ICD-10-CM | POA: Diagnosis not present

## 2024-04-09 DIAGNOSIS — K219 Gastro-esophageal reflux disease without esophagitis: Secondary | ICD-10-CM | POA: Diagnosis not present

## 2024-04-15 DIAGNOSIS — R109 Unspecified abdominal pain: Secondary | ICD-10-CM | POA: Diagnosis not present

## 2024-04-15 DIAGNOSIS — K760 Fatty (change of) liver, not elsewhere classified: Secondary | ICD-10-CM | POA: Diagnosis not present

## 2024-04-16 DIAGNOSIS — R109 Unspecified abdominal pain: Secondary | ICD-10-CM | POA: Diagnosis not present

## 2024-04-24 DIAGNOSIS — N289 Disorder of kidney and ureter, unspecified: Secondary | ICD-10-CM | POA: Diagnosis not present

## 2024-05-06 DIAGNOSIS — E538 Deficiency of other specified B group vitamins: Secondary | ICD-10-CM | POA: Diagnosis not present

## 2024-05-08 DIAGNOSIS — K432 Incisional hernia without obstruction or gangrene: Secondary | ICD-10-CM | POA: Diagnosis not present

## 2024-05-08 DIAGNOSIS — R1013 Epigastric pain: Secondary | ICD-10-CM | POA: Diagnosis not present

## 2024-05-13 DIAGNOSIS — R1013 Epigastric pain: Secondary | ICD-10-CM | POA: Diagnosis not present

## 2024-05-13 DIAGNOSIS — K219 Gastro-esophageal reflux disease without esophagitis: Secondary | ICD-10-CM | POA: Diagnosis not present

## 2024-05-13 DIAGNOSIS — D696 Thrombocytopenia, unspecified: Secondary | ICD-10-CM | POA: Diagnosis not present

## 2024-05-13 DIAGNOSIS — K432 Incisional hernia without obstruction or gangrene: Secondary | ICD-10-CM | POA: Diagnosis not present

## 2024-05-13 DIAGNOSIS — N189 Chronic kidney disease, unspecified: Secondary | ICD-10-CM | POA: Diagnosis not present

## 2024-05-13 DIAGNOSIS — E785 Hyperlipidemia, unspecified: Secondary | ICD-10-CM | POA: Diagnosis not present

## 2024-05-13 DIAGNOSIS — I447 Left bundle-branch block, unspecified: Secondary | ICD-10-CM | POA: Diagnosis not present

## 2024-06-11 DIAGNOSIS — E538 Deficiency of other specified B group vitamins: Secondary | ICD-10-CM | POA: Diagnosis not present

## 2024-07-15 DIAGNOSIS — E538 Deficiency of other specified B group vitamins: Secondary | ICD-10-CM | POA: Diagnosis not present

## 2024-08-06 DIAGNOSIS — M545 Low back pain, unspecified: Secondary | ICD-10-CM | POA: Diagnosis not present

## 2024-08-06 DIAGNOSIS — M47817 Spondylosis without myelopathy or radiculopathy, lumbosacral region: Secondary | ICD-10-CM | POA: Diagnosis not present

## 2024-08-06 DIAGNOSIS — M539 Dorsopathy, unspecified: Secondary | ICD-10-CM | POA: Diagnosis not present

## 2024-08-06 DIAGNOSIS — M47816 Spondylosis without myelopathy or radiculopathy, lumbar region: Secondary | ICD-10-CM | POA: Diagnosis not present

## 2024-08-06 DIAGNOSIS — M5137 Other intervertebral disc degeneration, lumbosacral region with discogenic back pain only: Secondary | ICD-10-CM | POA: Diagnosis not present

## 2024-08-06 DIAGNOSIS — R0989 Other specified symptoms and signs involving the circulatory and respiratory systems: Secondary | ICD-10-CM | POA: Diagnosis not present

## 2024-08-07 DIAGNOSIS — R0989 Other specified symptoms and signs involving the circulatory and respiratory systems: Secondary | ICD-10-CM | POA: Diagnosis not present
